# Patient Record
Sex: Female | Born: 1990 | Race: White | Hispanic: No | Marital: Married | Smoking: Never smoker
Health system: Southern US, Community
[De-identification: ages and names within clinical notes are randomized; demographics above are authoritative.]

## PROBLEM LIST (undated history)

## (undated) ENCOUNTER — Inpatient Hospital Stay (HOSPITAL_COMMUNITY): Payer: Self-pay

## (undated) DIAGNOSIS — F53 Postpartum depression: Secondary | ICD-10-CM

## (undated) DIAGNOSIS — Z789 Other specified health status: Secondary | ICD-10-CM

## (undated) DIAGNOSIS — Z87898 Personal history of other specified conditions: Secondary | ICD-10-CM

## (undated) DIAGNOSIS — Z9151 Personal history of suicidal behavior: Secondary | ICD-10-CM

## (undated) HISTORY — PX: NO PAST SURGERIES: SHX2092

## (undated) HISTORY — DX: Other specified health status: Z78.9

---

## 2013-11-03 ENCOUNTER — Encounter (HOSPITAL_COMMUNITY): Payer: Self-pay | Admitting: Emergency Medicine

## 2013-11-03 ENCOUNTER — Emergency Department (INDEPENDENT_AMBULATORY_CARE_PROVIDER_SITE_OTHER)
Admission: EM | Admit: 2013-11-03 | Discharge: 2013-11-03 | Disposition: A | Discharge status: Home / Self Care | Payer: Self-pay | Source: Home / Self Care

## 2013-11-03 ENCOUNTER — Other Ambulatory Visit (HOSPITAL_COMMUNITY)
Admission: RE | Admit: 2013-11-03 | Discharge: 2013-11-03 | Disposition: A | Discharge status: Home / Self Care | Payer: Self-pay | Source: Ambulatory Visit | Attending: Family Medicine | Admitting: Family Medicine

## 2013-11-03 DIAGNOSIS — Z113 Encounter for screening for infections with a predominantly sexual mode of transmission: Secondary | ICD-10-CM | POA: Insufficient documentation

## 2013-11-03 DIAGNOSIS — R300 Dysuria: Secondary | ICD-10-CM

## 2013-11-03 DIAGNOSIS — N72 Inflammatory disease of cervix uteri: Secondary | ICD-10-CM

## 2013-11-03 DIAGNOSIS — N898 Other specified noninflammatory disorders of vagina: Secondary | ICD-10-CM

## 2013-11-03 DIAGNOSIS — N760 Acute vaginitis: Secondary | ICD-10-CM | POA: Insufficient documentation

## 2013-11-03 LAB — POCT URINALYSIS DIP (DEVICE)
BILIRUBIN URINE: NEGATIVE
GLUCOSE, UA: NEGATIVE mg/dL
Ketones, ur: NEGATIVE mg/dL
NITRITE: NEGATIVE
PH: 6 (ref 5.0–8.0)
Protein, ur: 30 mg/dL — AB
Specific Gravity, Urine: 1.025 (ref 1.005–1.030)
Urobilinogen, UA: 0.2 mg/dL (ref 0.0–1.0)

## 2013-11-03 LAB — POCT PREGNANCY, URINE: Preg Test, Ur: NEGATIVE

## 2013-11-03 MED ORDER — METRONIDAZOLE 500 MG PO TABS: 500.0000 mg | ORAL_TABLET | Freq: Two times a day (BID) | ORAL | Status: DC

## 2013-11-03 MED ORDER — FLUCONAZOLE 150 MG PO TABS: ORAL_TABLET | ORAL | Status: DC

## 2013-11-03 NOTE — ED Notes (Signed)
Call back number verified.  

## 2013-11-03 NOTE — ED Provider Notes (Addendum)
CSN: [id number]     Arrival date & time 11/03/13  1028 History   First MD Initiated Contact with Patient 11/03/13 1046     Chief Complaint  Patient presents with  . Vaginal Discharge   (Consider location/radiation/quality/duration/timing/severity/associated sxs/prior Treatment) HPI Comments: 23 year old female complaining of dysuria for 2 weeks. This is associated with a vaginal discharge and odor. Denies frequency or urgency. Denies abdominal or pelvic pain. She is sexually active without using birth control. LMP 10/12/2013   History reviewed. No pertinent past medical history. History reviewed. No pertinent past surgical history. No family history on file. History  Substance Use Topics  . Smoking status: Never Smoker   . Smokeless tobacco: Not on file  . Alcohol Use: No   OB History   Grav Para Term Preterm Abortions TAB SAB Ect Mult Living                 Review of Systems  Constitutional: Negative.   Cardiovascular: Negative.   Gastrointestinal: Negative for nausea and vomiting.  Genitourinary: Positive for dysuria and vaginal discharge. Negative for urgency, frequency, flank pain, vaginal bleeding and pelvic pain.  Musculoskeletal: Negative.     Allergies  Review of patient's allergies indicates no known allergies.  Home Medications   Prior to Admission medications   Not on File   BP 127/77  Pulse 70  Temp(Src) 98 F (36.7 C) (Oral)  Resp 14  SpO2 100%  LMP 10/12/2013 Physical Exam  Nursing note and vitals reviewed. Constitutional: She is oriented to person, place, and time. She appears well-developed and well-nourished. No distress.  Neck: Normal range of motion. Neck supple.  Pulmonary/Chest: Effort normal. No respiratory distress.  Genitourinary:  Normal external female genitalia There is erythema to the mucosa of the labia majora and minora as well as the urethral meatus. Moderate amount of creamy white to green discharge on the vaginal walls and  within the vaginal vault. Cervix is midline, os nulliparous, ectocx with erythema and abrased appearance, friable and mildly tender. No CMT , equivocal bilat adnexal tenderness without palpable mass or ovary.  Musculoskeletal: She exhibits no edema and no tenderness.  Neurological: She is alert and oriented to person, place, and time. She exhibits normal muscle tone.  Skin: Skin is warm and dry.  Psychiatric: She has a normal mood and affect.    ED Course  Procedures (including critical care time) Labs Review Labs Reviewed  POCT URINALYSIS DIP (DEVICE) - Abnormal; Notable for the following:    Hgb urine dipstick TRACE (*)    Protein, ur 30 (*)    Leukocytes, UA SMALL (*)    All other components within normal limits  URINE CULTURE  POCT PREGNANCY, URINE  CERVICOVAGINAL ANCILLARY ONLY    Imaging Review No results found. Results for orders placed during the hospital encounter of 11/03/13  POCT URINALYSIS DIP (DEVICE)      Result Value Ref Range   Glucose, UA NEGATIVE  NEGATIVE mg/dL   Bilirubin Urine NEGATIVE  NEGATIVE   Ketones, ur NEGATIVE  NEGATIVE mg/dL   Specific Gravity, Urine 1.025  1.005 - 1.030   Hgb urine dipstick TRACE (*) NEGATIVE   pH 6.0  5.0 - 8.0   Protein, ur 30 (*) NEGATIVE mg/dL   Urobilinogen, UA 0.2  0.0 - 1.0 mg/dL   Nitrite NEGATIVE  NEGATIVE   Leukocytes, UA SMALL (*) NEGATIVE  POCT PREGNANCY, URINE      Result Value Ref Range   Preg Test, Ur NEGATIVE  NEGATIVE     MDM   1. Vaginal discharge   2. Cervicitis   3. Dysuria     Most likely the dysuria is due to vulvovaginal irritation from candidiasis. Will hold ABX until culture returns Flagyl bid Diflucan 150 mg x 2 . Cytology pending. Will tx accordingly    Hayden Rasmussenavid Skyelyn Scruggs, NP 11/03/13 1123 11/07/13: Chlamydia pos. Tx with azithromycin 1 gm po via Rx. Rosalita ChessmanSuzanne, RN to call pt and Rx in.  Hayden Rasmussenavid Trinka Keshishyan, NP 11/07/13 1555

## 2013-11-03 NOTE — ED Notes (Addendum)
Pt c/o white vag d/c onset 2 weeks; Partner being treated for Chlam Sx also include dysuria and foul odor Denies f/v/n/d, abd pain; LMP = 10/12/13 Alert w/no signs of acute distress.

## 2013-11-03 NOTE — Discharge Instructions (Signed)
Cervicitis Cervicitis is a soreness and swelling (inflammation) of the cervix. Your cervix is located at the bottom of your uterus. It opens up to the vagina. CAUSES   Sexually transmitted infections (STIs).   Allergic reaction.   Medicines or birth control devices that are put in the vagina.   Injury to the cervix.   Bacterial infections.  RISK FACTORS You are at greater risk if you:  Have unprotected sexual intercourse.  Have sexual intercourse with many partners.  Began sexual intercourse at an early age.  Have a history of STIs. SYMPTOMS  There may be no symptoms. If symptoms occur, they may include:   Grey, white, yellow, or bad-smelling vaginal discharge.   Pain or itching of the area outside the vagina.   Painful sexual intercourse.   Lower abdominal or lower back pain, especially during intercourse.   Frequent urination.   Abnormal vaginal bleeding between periods, after sexual intercourse, or after menopause.   Pressure or a heavy feeling in the pelvis.  DIAGNOSIS  Diagnosis is made after a pelvic exam. Other tests may include:   Examination of any discharge under a microscope (wet prep).   A Pap test.  TREATMENT  Treatment will depend on the cause of cervicitis. If it is caused by an STI, both you and your partner will need to be treated. Antibiotic medicines will be given.  HOME CARE INSTRUCTIONS   Do not have sexual intercourse until your health care provider says it is okay.   Do not have sexual intercourse until your partner has been treated, if your cervicitis is caused by an STI.   Take your antibiotics as directed. Finish them even if you start to feel better.  SEEK MEDICAL CARE IF:  Your symptoms come back.   You have a fever.  MAKE SURE YOU:   Understand these instructions.  Will watch your condition.  Will get help right away if you are not doing well or get worse. Document Released: 06/01/2005 Document Revised:  02/01/2013 Document Reviewed: 11/23/2012 [facility] Patient Information 2014 [place].  Dysuria Dysuria is the medical term for pain with urination. There are many causes for dysuria, but urinary tract infection is the most common. If a urinalysis was performed it can show that there is a urinary tract infection. A urine culture confirms that you or your child is sick. You will need to follow up with a healthcare provider because:  If a urine culture was done you will need to know the culture results and treatment recommendations.  If the urine culture was positive, you or your child will need to be put on antibiotics or know if the antibiotics prescribed are the right antibiotics for your urinary tract infection.  If the urine culture is negative (no urinary tract infection), then other causes may need to be explored or antibiotics need to be stopped. Today laboratory work may have been done and there does not seem to be an infection. If cultures were done they will take at least 24 to 48 hours to be completed. Today x-rays may have been taken and they read as normal. No cause can be found for the problems. The x-rays may be re-read by a radiologist and you will be contacted if additional findings are made. You or your child may have been put on medications to help with this problem until you can see your primary caregiver. If the problems get better, see your primary caregiver if the problems return. If you were given  antibiotics (medications which kill germs), take all of the mediations as directed for the full course of treatment.  If laboratory work was done, you need to find the results. Leave a telephone number where you can be reached. If this is not possible, make sure you find out how you are to get test results. HOME CARE INSTRUCTIONS   Drink lots of fluids. For adults, drink eight, 8 ounce glasses of clear juice or water a day. For children, replace fluids as suggested by your  caregiver.  Empty the bladder often. Avoid holding urine for long periods of time.  After a bowel movement, women should cleanse front to back, using each tissue only once.  Empty your bladder before and after sexual intercourse.  Take all the medicine given to you until it is gone. You may feel better in a few days, but TAKE ALL MEDICINE.  Avoid caffeine, tea, alcohol and carbonated beverages, because they tend to irritate the bladder.  In men, alcohol may irritate the prostate.  Only take over-the-counter or prescription medicines for pain, discomfort, or fever as directed by your caregiver.  If your caregiver has given you a follow-up appointment, it is very important to keep that appointment. Not keeping the appointment could result in a chronic or permanent injury, pain, and disability. If there is any problem keeping the appointment, you must call back to this facility for assistance. SEEK IMMEDIATE MEDICAL CARE IF:   Back pain develops.  A fever develops.  There is nausea (feeling sick to your stomach) or vomiting (throwing up).  Problems are no better with medications or are getting worse. MAKE SURE YOU:   Understand these instructions.  Will watch your condition.  Will get help right away if you are not doing well or get worse. Document Released: 02/28/2004 Document Revised: 08/24/2011 Document Reviewed: 01/05/2008 [facility] Patient Information 2014 [place], [place].  Sexually Transmitted Disease A sexually transmitted disease (STD) is a disease or infection. It may be passed from person to person. It usually is passed during sex. STDs can be spread by different types of germs. These germs are bacteria, viruses, and parasites. An STD can be passed through:  Spit (saliva).  Semen.  Blood.  Mucus from the vagina.  Pee (urine). HOW CAN I LESSEN MY CHANCES OF GETTING AN STD?  Only use condoms labeled "latex," dental dams, and lubricants that wash away with water  (water soluble). Do not use petroleum jelly or oils.  Get shots (vaccines) for HPV and hepatitis.  Avoid risky sex behavior that can break the skin. WHAT SHOULD I DO IF I THINK I HAVE AN STD?  See your doctor.  Tell your sex partner(s) that you have an STD. They should be tested and treated.  Do not have sex until your doctor says it is OK. WHEN SHOULD I GET HELP? Get help if:  You have bad belly (abdominal) pain.  You are a man and have puffiness (swelling) or pain in your testicles.  You are a woman and have puffiness in your vagina. MAKE SURE YOU:  Understand these instructions. Document Released: 07/09/2004 Document Revised: 03/22/2013 Document Reviewed: 11/25/2012 [facility] Patient Information 2014 [place], [place].

## 2013-11-04 NOTE — ED Provider Notes (Signed)
Medical screening examination/treatment/procedure(s) were performed by resident physician or non-physician practitioner and as supervising physician I was immediately available for consultation/collaboration.   Averianna Brugger DOUGLAS MD.   Nellene Courtois D Olden Klauer, MD 11/04/13 1128 

## 2013-11-07 ENCOUNTER — Telehealth (HOSPITAL_COMMUNITY): Payer: Self-pay | Admitting: *Deleted

## 2013-11-07 MED ORDER — AZITHROMYCIN 250 MG PO TABS: 250.0000 mg | ORAL_TABLET | Freq: Every day | ORAL | Status: DC

## 2013-11-07 NOTE — ED Notes (Addendum)
GC neg., Chlamydia pos., Affirm: Candida and Gardnerella neg., Trich pos.  Pt. adequately treated with Flagyl.  Hayden Rasmussen NP did order for Zithromax- no pharmacy listed. I called pt. and left a message to call. Call 1. Sheri Hill 11/07/2013 Pt. called back.  Pt. verified x 2 and given results.  Pt. told she was adequately treated for Trich with Flagyl.  You need to notify your partner to be treated with Flagyl, no sex until you have finished your medication and your partner has been treated and to practice safe sex. You can get HIV testing at the [facility] STD clinic.  Pt. told she needs Zithromax for Chlamydia. Pt. wants Rx. called to [facility] at [address]  Rx. called to pharmacist @ [phone]. DHHS form completed and faxed to the [facility] Department. Sheri Hill 11/07/2013

## 2013-11-07 NOTE — ED Provider Notes (Signed)
Medical screening examination/treatment/procedure(s) were performed by resident physician or non-physician practitioner and as supervising physician I was immediately available for consultation/collaboration.   Serena Petterson DOUGLAS MD.   Atticus Wedin D Afshin Chrystal, MD 11/07/13 1644 

## 2014-03-24 ENCOUNTER — Emergency Department (HOSPITAL_COMMUNITY)
Admission: EM | Admit: 2014-03-24 | Discharge: 2014-03-24 | Disposition: A | Discharge status: Home / Self Care | Payer: Self-pay | Source: Home / Self Care

## 2014-03-24 ENCOUNTER — Encounter (HOSPITAL_COMMUNITY): Payer: Self-pay | Admitting: Emergency Medicine

## 2014-03-24 DIAGNOSIS — N944 Primary dysmenorrhea: Secondary | ICD-10-CM

## 2014-03-24 LAB — POCT URINALYSIS DIP (DEVICE)
BILIRUBIN URINE: NEGATIVE
GLUCOSE, UA: NEGATIVE mg/dL
Hgb urine dipstick: NEGATIVE
Ketones, ur: NEGATIVE mg/dL
LEUKOCYTES UA: NEGATIVE
Nitrite: NEGATIVE
PH: 6.5 (ref 5.0–8.0)
Protein, ur: NEGATIVE mg/dL
Specific Gravity, Urine: <=1.005 (ref 1.005–1.030)
Urobilinogen, UA: 0.2 mg/dL (ref 0.0–1.0)

## 2014-03-24 LAB — POCT PREGNANCY, URINE: Preg Test, Ur: NEGATIVE

## 2014-03-24 MED ORDER — TRAMADOL HCL 50 MG PO TABS
50.0000 mg | ORAL_TABLET | Freq: Four times a day (QID) | ORAL | Status: DC | PRN
Start: 2014-03-24 — End: 2015-02-22

## 2014-03-24 NOTE — ED Provider Notes (Signed)
CSN: [id number]     Arrival date & time 03/24/14  1755 History   First MD Initiated Contact with Patient 03/24/14 1843     Chief Complaint  Patient presents with  . Dysmenorrhea   (Consider location/radiation/quality/duration/timing/severity/associated sxs/prior Treatment) HPI Comments: Dysmenorrhea for approx 10 yrs, since onset menarch. Mid pelvic cramping starts about 6 days prior to flow. It then is worse, more painful for another week. Flow is unchanged. Menses regular with a 28 d cycle. None missed.  No new sx's today or recently. Never had this worked up. St she is debilitated at least 3 days a month. Minor pain currently. Has taken several NSAIDS at high doses without relief.    History reviewed. No pertinent past medical history. History reviewed. No pertinent past surgical history. History reviewed. No pertinent family history. History  Substance Use Topics  . Smoking status: Never Smoker   . Smokeless tobacco: Not on file  . Alcohol Use: No   OB History   Grav Para Term Preterm Abortions TAB SAB Ect Mult Living                 Review of Systems  Constitutional: Positive for activity change. Negative for fever and fatigue.  Respiratory: Negative.   Cardiovascular: Negative.   Gastrointestinal: Negative.   Genitourinary: Positive for menstrual problem and pelvic pain. Negative for dysuria, frequency, flank pain, vaginal bleeding, vaginal discharge and difficulty urinating.       No current flow.  Musculoskeletal: Negative.   Skin: Negative.   Neurological: Negative.     Allergies  Review of patient's allergies indicates no known allergies.  Home Medications   Prior to Admission medications   Medication Sig Start Date End Date Taking? Authorizing Provider  traMADol (ULTRAM) 50 MG tablet Take 1 tablet (50 mg total) by mouth every 6 (six) hours as needed. 03/24/14   Hayden Rasmussenavid Tennyson Kallen, NP   BP 129/72  Pulse 71  Temp(Src) 98.3 F (36.8 C) (Oral)  Resp 16  SpO2  99% Physical Exam  Nursing note and vitals reviewed. Constitutional: She is oriented to person, place, and time. She appears well-developed and well-nourished. She appears distressed.  Eyes: Conjunctivae and EOM are normal.  Neck: Normal range of motion. Neck supple.  Cardiovascular: Normal rate, regular rhythm, normal heart sounds and intact distal pulses.   Pulmonary/Chest: Effort normal and breath sounds normal. No respiratory distress.  Abdominal: Soft. She exhibits no distension. There is no tenderness. There is no rebound.  Musculoskeletal: She exhibits no edema and no tenderness.  Lymphadenopathy:    She has no cervical adenopathy.  Neurological: She is alert and oriented to person, place, and time. No cranial nerve deficit. She exhibits normal muscle tone.  Skin: Skin is warm and dry.  Psychiatric: She has a normal mood and affect.    ED Course  Procedures (including critical care time) Labs Review Labs Reviewed  POCT URINALYSIS DIP (DEVICE)  POCT PREGNANCY, URINE    Imaging Review No results found.   MDM   1. Primary dysmenorrhea    Continue naprosyn and start Tramadol prn Call Dr. Duanne Guessewey Monday for appointment    Hayden Rasmussenavid Brittinie Wherley, NP 03/24/14 1902

## 2014-03-24 NOTE — Discharge Instructions (Signed)

## 2014-03-24 NOTE — ED Notes (Signed)
C/o bad menstrual cramps. History of chlamydia, not sexually active at present

## 2014-03-24 NOTE — ED Provider Notes (Signed)
Medical screening examination/treatment/procedure(s) were performed by non-physician practitioner and as supervising physician I was immediately available for consultation/collaboration.  Leslee Homeavid Mariaisabel Bodiford, M.D.  Reuben Likesavid C Suzetta Timko, MD 03/24/14 Ernestina [id number]

## 2015-01-13 ENCOUNTER — Emergency Department (INDEPENDENT_AMBULATORY_CARE_PROVIDER_SITE_OTHER): Payer: Self-pay

## 2015-01-13 ENCOUNTER — Emergency Department (INDEPENDENT_AMBULATORY_CARE_PROVIDER_SITE_OTHER)
Admission: EM | Admit: 2015-01-13 | Discharge: 2015-01-13 | Disposition: A | Discharge status: Nursing Home (Includes ICF) | Payer: Self-pay | Source: Home / Self Care

## 2015-01-13 ENCOUNTER — Encounter (HOSPITAL_COMMUNITY): Payer: Self-pay

## 2015-01-13 ENCOUNTER — Emergency Department (HOSPITAL_COMMUNITY): Payer: Self-pay

## 2015-01-13 ENCOUNTER — Emergency Department (HOSPITAL_COMMUNITY)
Admission: EM | Admit: 2015-01-13 | Discharge: 2015-01-14 | Disposition: A | Discharge status: Home / Self Care | Payer: Self-pay | Attending: Emergency Medicine | Admitting: Emergency Medicine

## 2015-01-13 ENCOUNTER — Encounter (HOSPITAL_COMMUNITY): Payer: Self-pay | Admitting: Emergency Medicine

## 2015-01-13 DIAGNOSIS — S161XXA Strain of muscle, fascia and tendon at neck level, initial encounter: Secondary | ICD-10-CM | POA: Insufficient documentation

## 2015-01-13 DIAGNOSIS — R51 Headache: Secondary | ICD-10-CM

## 2015-01-13 DIAGNOSIS — M542 Cervicalgia: Secondary | ICD-10-CM

## 2015-01-13 DIAGNOSIS — Y998 Other external cause status: Secondary | ICD-10-CM | POA: Insufficient documentation

## 2015-01-13 DIAGNOSIS — S0990XA Unspecified injury of head, initial encounter: Secondary | ICD-10-CM | POA: Insufficient documentation

## 2015-01-13 DIAGNOSIS — Y9389 Activity, other specified: Secondary | ICD-10-CM | POA: Insufficient documentation

## 2015-01-13 DIAGNOSIS — Z3202 Encounter for pregnancy test, result negative: Secondary | ICD-10-CM | POA: Insufficient documentation

## 2015-01-13 DIAGNOSIS — R519 Headache, unspecified: Secondary | ICD-10-CM

## 2015-01-13 DIAGNOSIS — Y92410 Unspecified street and highway as the place of occurrence of the external cause: Secondary | ICD-10-CM | POA: Insufficient documentation
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
## 2015-01-13 NOTE — ED Notes (Signed)
Pt arrives in soft c-collar from [facility].

## 2015-01-14 LAB — PREGNANCY, URINE: PREG TEST UR: NEGATIVE

## 2015-01-14 NOTE — ED Notes (Signed)
Soft collar removed by EDP

## 2015-01-14 NOTE — Discharge Instructions (Signed)
Cervical Sprain A cervical sprain is when the tissues (ligaments) that hold the neck bones in place stretch or tear. HOME CARE   Put ice on the injured area.  Put ice in a plastic bag.  Place a towel between your skin and the bag.  Leave the ice on for 15-20 minutes, 3-4 times a day.  You may have been given a collar to wear. This collar keeps your neck from moving while you heal.  Do not take the collar off unless told by your doctor.  If you have long hair, keep it outside of the collar.  Ask your doctor before changing the position of your collar. You may need to change its position over time to make it more comfortable.  If you are allowed to take off the collar for cleaning or bathing, follow your doctor's instructions on how to do it safely.  Keep your collar clean by wiping it with mild soap and water. Dry it completely. If the collar has removable pads, remove them every 1-2 days to hand wash them with soap and water. Allow them to air dry. They should be dry before you wear them in the collar.  Do not drive while wearing the collar.  Only take medicine as told by your doctor.  Keep all doctor visits as told.  Keep all physical therapy visits as told.  Adjust your work station so that you have good posture while you work.  Avoid positions and activities that make your problems worse.  Warm up and stretch before being active. GET HELP IF:  Your pain is not controlled with medicine.  You cannot take less pain medicine over time as planned.  Your activity level does not improve as expected. GET HELP RIGHT AWAY IF:   You are bleeding.  Your stomach is upset.  You have an allergic reaction to your medicine.  You develop new problems that you cannot explain.  You lose feeling (become numb) or you cannot move any part of your body (paralysis).  You have tingling or weakness in any part of your body.  Your symptoms get worse. Symptoms include:  Pain,  soreness, stiffness, puffiness (swelling), or a burning feeling in your neck.  Pain when your neck is touched.  Shoulder or upper back pain.  Limited ability to move your neck.  Headache.  Dizziness.  Your hands or arms feel week, lose feeling, or tingle.  Muscle spasms.  Difficulty swallowing or chewing. MAKE SURE YOU:   Understand these instructions.  Will watch your condition.  Will get help right away if you are not doing well or get worse. Document Released: 11/18/2007 Document Revised: 02/01/2013 Document Reviewed: 12/07/2012 ExitCare Patient Information 2015 [employer]. This information is not intended to replace advice given to you by your health care provider. Make sure you discuss any questions you have with your health care provider.  

## 2015-02-22 ENCOUNTER — Encounter (HOSPITAL_COMMUNITY): Payer: Self-pay | Admitting: Emergency Medicine

## 2015-02-22 ENCOUNTER — Emergency Department (INDEPENDENT_AMBULATORY_CARE_PROVIDER_SITE_OTHER)
Admission: EM | Admit: 2015-02-22 | Discharge: 2015-02-22 | Disposition: A | Discharge status: Home / Self Care | Payer: Self-pay | Source: Home / Self Care | Attending: Family Medicine | Admitting: Family Medicine

## 2015-02-22 ENCOUNTER — Other Ambulatory Visit (HOSPITAL_COMMUNITY)
Admission: RE | Admit: 2015-02-22 | Discharge: 2015-02-22 | Disposition: A | Discharge status: Home / Self Care | Payer: Self-pay | Source: Ambulatory Visit | Attending: Family Medicine | Admitting: Family Medicine

## 2015-02-22 DIAGNOSIS — N760 Acute vaginitis: Secondary | ICD-10-CM | POA: Insufficient documentation

## 2015-02-22 DIAGNOSIS — Z113 Encounter for screening for infections with a predominantly sexual mode of transmission: Secondary | ICD-10-CM | POA: Insufficient documentation

## 2015-02-22 LAB — POCT URINALYSIS DIP (DEVICE)
Bilirubin Urine: NEGATIVE
Glucose, UA: NEGATIVE mg/dL
Hgb urine dipstick: NEGATIVE
Ketones, ur: NEGATIVE mg/dL
Leukocytes, UA: NEGATIVE
Nitrite: NEGATIVE
Protein, ur: NEGATIVE mg/dL
Specific Gravity, Urine: 1.015 (ref 1.005–1.030)
Urobilinogen, UA: 0.2 mg/dL (ref 0.0–1.0)
pH: 6 (ref 5.0–8.0)

## 2015-02-22 LAB — POCT PREGNANCY, URINE: Preg Test, Ur: NEGATIVE

## 2015-02-22 MED ORDER — METRONIDAZOLE 500 MG PO TABS: 500.0000 mg | ORAL_TABLET | Freq: Two times a day (BID) | ORAL | Status: DC

## 2015-02-22 NOTE — ED Notes (Signed)
Pt c/o vag d/c onset 1 week associated w/foul odor and vaginal pain Has tried OTC monistat and believes she may have had a reaction to it Denies abd pain, fevers, chills, urinary sx Alert and oriented x4... No acute distress.

## 2015-02-22 NOTE — Discharge Instructions (Signed)
You likely have bacterial vaginosis. Please start the metronidazole. We will call you if any other lab results come back positive.

## 2015-02-22 NOTE — ED Provider Notes (Signed)
CSN: [id number]     Arrival date & time 02/22/15  1711 History   First MD Initiated Contact with Patient 02/22/15 1740     Chief Complaint  Patient presents with  . Vaginal Discharge   (Consider location/radiation/quality/duration/timing/severity/associated sxs/prior Treatment) HPI   Vaginal itching and burning. Started 1 week ago. Patient use Monistat couple days after onset of symptoms. This did not improve her symptoms in fact she feels like this made it worse. She subsequently developed vaginal discharge. This is foul-smelling and discolored. Patient essentially active with her husband. No condoms are used. Denies any lower abdominal pain, fevers, dysuria, frequency, back pain, rash, adenopathy. States that she has had a history of chlamydia infection before. Last metastases appeared was on a 02/05/2015.   History reviewed. No pertinent past medical history. History reviewed. No pertinent past surgical history. Family History  Problem Relation Age of Onset  . Cancer Neg Hx   . Diabetes Neg Hx   . Heart failure Neg Hx    Social History  Substance Use Topics  . Smoking status: Never Smoker   . Smokeless tobacco: None  . Alcohol Use: Yes   OB History    No data available     Review of Systems Per HPI with all other pertinent systems negative.   Allergies  Review of patient's allergies indicates no known allergies.  Home Medications   Prior to Admission medications   Medication Sig Start Date End Date Taking? Authorizing Provider  metroNIDAZOLE (FLAGYL) 500 MG tablet Take 1 tablet (500 mg total) by mouth 2 (two) times daily. 02/22/15   Ozella Rocks, MD   Meds Ordered and Administered this Visit  Medications - No data to display  BP 129/73 mmHg  Pulse 76  Temp(Src) 98.3 F (36.8 C) (Oral)  Resp 12  SpO2 100%  LMP 02/05/2015 No data found.   Physical Exam Physical Exam  Constitutional: oriented to person, place, and time. appears well-developed and  well-nourished. No distress.  HENT:  Head: Normocephalic and atraumatic.  Eyes: EOMI. PERRL.  Neck: Normal range of motion.  Cardiovascular: RRR, no m/r/g, 2+ distal pulses,  Pulmonary/Chest: Effort normal and breath sounds normal. No respiratory distress.  Abdominal: Soft. Bowel sounds are normal. NonTTP, no distension.  Musculoskeletal: Normal range of motion. Non ttp, no effusion.  Neurological: alert and oriented to person, place, and time.  Skin: Skin is warm. No rash noted. non diaphoretic.  Psychiatric: normal mood and affect. behavior is normal. Judgment and thought content normal.  GU: Vaginal walls well rugated, no lesions appreciated, no cervical motion tenderness, copious thick white discharge. ED Course  Procedures (including critical care time)  Labs Review Labs Reviewed  POCT URINALYSIS DIP (DEVICE)  POCT PREGNANCY, URINE  CYTOLOGY, (ORAL, ANAL, URETHRAL) ANCILLARY ONLY    Imaging Review No results found.   Visual Acuity Review  Right Eye Distance:   Left Eye Distance:   Bilateral Distance:    Right Eye Near:   Left Eye Near:    Bilateral Near:         MDM   1. Vaginitis    Suspect BV. Start Metro. Wet prep and STD panel sent.    Ozella Rocks, MD 02/22/15 [phone]

## 2015-02-22 NOTE — ED Notes (Signed)
Call back number verified.  

## 2015-02-25 LAB — CYTOLOGY, (ORAL, ANAL, URETHRAL) ANCILLARY ONLY
Chlamydia: NEGATIVE
NEISSERIA GONORRHEA: NEGATIVE

## 2015-02-26 LAB — CYTOLOGY, (ORAL, ANAL, URETHRAL) ANCILLARY ONLY: WET PREP (BD AFFIRM): POSITIVE — AB

## 2015-02-26 NOTE — ED Notes (Signed)
Final report of vaginal swabs positive for gardnerella. Called number listed, but husband has phone w him at work. Will attempt to call patient later when I can speak w her directly

## 2015-08-21 ENCOUNTER — Encounter (HOSPITAL_COMMUNITY): Payer: Self-pay | Admitting: Emergency Medicine

## 2015-08-21 ENCOUNTER — Emergency Department (INDEPENDENT_AMBULATORY_CARE_PROVIDER_SITE_OTHER)
Admission: EM | Admit: 2015-08-21 | Discharge: 2015-08-21 | Disposition: A | Discharge status: Home / Self Care | Payer: Medicaid Other | Source: Home / Self Care | Attending: Family Medicine | Admitting: Family Medicine

## 2015-08-21 DIAGNOSIS — R69 Illness, unspecified: Principal | ICD-10-CM

## 2015-08-21 DIAGNOSIS — J111 Influenza due to unidentified influenza virus with other respiratory manifestations: Secondary | ICD-10-CM | POA: Diagnosis not present

## 2015-08-21 LAB — POCT URINALYSIS DIP (DEVICE)
BILIRUBIN URINE: NEGATIVE
GLUCOSE, UA: NEGATIVE mg/dL
HGB URINE DIPSTICK: NEGATIVE
Ketones, ur: NEGATIVE mg/dL
LEUKOCYTES UA: NEGATIVE
NITRITE: NEGATIVE
Protein, ur: NEGATIVE mg/dL
Specific Gravity, Urine: 1.025 (ref 1.005–1.030)
Urobilinogen, UA: 0.2 mg/dL (ref 0.0–1.0)
pH: 6 (ref 5.0–8.0)

## 2015-08-21 MED ORDER — IPRATROPIUM BROMIDE 0.06 % NA SOLN: 2.0000 | Freq: Four times a day (QID) | NASAL | Status: DC

## 2015-08-21 NOTE — ED Provider Notes (Signed)
CSN: [id number]     Arrival date & time 08/21/15  1545 History   First MD Initiated Contact with Patient 08/21/15 1800     Chief Complaint  Patient presents with  . Influenza    fever, runny nose, body aches, fatigue   (Consider location/radiation/quality/duration/timing/severity/associated sxs/prior Treatment) Patient is a 25 y.o. female presenting with flu symptoms. The history is provided by the patient.  Influenza Presenting symptoms: cough, fever, myalgias, rhinorrhea and sore throat   Presenting symptoms: no shortness of breath and no vomiting   Severity:  Mild Onset quality:  Gradual Duration:  3 days Progression:  Unchanged Chronicity:  New Relieved by:  None tried Worsened by:  Nothing tried Associated symptoms: nasal congestion   Associated symptoms: no chills   Risk factors: pregnancy and sick contacts   Risk factors comment:  Husband sick   History reviewed. No pertinent past medical history. History reviewed. No pertinent past surgical history. Family History  Problem Relation Age of Onset  . Cancer Neg Hx   . Diabetes Neg Hx   . Heart failure Neg Hx    Social History  Substance Use Topics  . Smoking status: Never Smoker   . Smokeless tobacco: None  . Alcohol Use: No   OB History    Gravida Para Term Preterm AB TAB SAB Ectopic Multiple Living   1              Review of Systems  Constitutional: Positive for fever. Negative for chills.  HENT: Positive for congestion, postnasal drip, rhinorrhea and sore throat.   Respiratory: Positive for cough. Negative for shortness of breath.   Cardiovascular: Negative.   Gastrointestinal: Negative.  Negative for vomiting.  Genitourinary: Negative.   Musculoskeletal: Positive for myalgias.  All other systems reviewed and are negative.   Allergies  Review of patient's allergies indicates no known allergies.  Home Medications   Prior to Admission medications   Medication Sig Start Date End Date Taking?  Authorizing Provider  Prenatal Vit-Fe Fumarate-FA (PRENATAL MULTIVITAMIN) TABS tablet Take 1 tablet by mouth daily at 12 noon.   Yes Historical Provider, MD  metroNIDAZOLE (FLAGYL) 500 MG tablet Take 1 tablet (500 mg total) by mouth 2 (two) times daily. 02/22/15   Ozella Rocksavid J Merrell, MD   Meds Ordered and Administered this Visit  Medications - No data to display  BP 124/81 mmHg  Pulse 112  Temp(Src) 99.2 F (37.3 C) (Oral)  Resp 18  Ht 5\' 4"  (1.626 m)  Wt 118 lb (53.524 kg)  BMI 20.24 kg/m2  SpO2 98%  LMP 02/05/2015 No data found.   Physical Exam  Constitutional: She is oriented to person, place, and time. She appears well-developed and well-nourished. No distress.  HENT:  Right Ear: External ear normal.  Left Ear: External ear normal.  Mouth/Throat: Oropharynx is clear and moist.  Neck: Normal range of motion. Neck supple.  Pulmonary/Chest: Effort normal and breath sounds normal.  Abdominal: Soft. Bowel sounds are normal. She exhibits mass.  24wk gravid uterus.  Lymphadenopathy:    She has no cervical adenopathy.  Neurological: She is alert and oriented to person, place, and time.  Skin: Skin is warm and dry.  Nursing note and vitals reviewed.   ED Course  Procedures (including critical care time)  Labs Review Labs Reviewed  POCT URINALYSIS DIP (DEVICE)    Imaging Review No results found.   Visual Acuity Review  Right Eye Distance:   Left Eye Distance:   Bilateral Distance:  Right Eye Near:   Left Eye Near:    Bilateral Near:         MDM  No diagnosis found. Meds ordered this encounter  Medications  . Prenatal Vit-Fe Fumarate-FA (PRENATAL MULTIVITAMIN) TABS tablet    Sig: Take 1 tablet by mouth daily at 12 noon.  Marland Kitchen ipratropium (ATROVENT) 0.06 % nasal spray    Sig: Place 2 sprays into both nostrils 4 (four) times daily.    Dispense:  15 mL    Refill:  1      Linna Hoff, MD 08/21/15 [phone]

## 2015-08-21 NOTE — Discharge Instructions (Signed)
Drink plenty of fluids as discussed, use medicine as prescribed, and mucinex or delsym for cough. Return or see your doctor if further problems °

## 2015-08-21 NOTE — ED Notes (Addendum)
PT reports fever, body aches, runny nose, and fatigue that started yesterday. Husband has similar symptoms. PT is [redacted] weeks pregnant. PT denies abdominal pain, back pain, vaginal bleeding, or discharge.

## 2015-08-28 ENCOUNTER — Other Ambulatory Visit (HOSPITAL_COMMUNITY): Payer: Self-pay | Admitting: Obstetrics and Gynecology

## 2015-08-28 DIAGNOSIS — O283 Abnormal ultrasonic finding on antenatal screening of mother: Secondary | ICD-10-CM

## 2015-08-30 ENCOUNTER — Encounter (HOSPITAL_COMMUNITY): Payer: Self-pay

## 2015-08-30 ENCOUNTER — Other Ambulatory Visit (HOSPITAL_COMMUNITY): Payer: Self-pay | Admitting: Obstetrics and Gynecology

## 2015-08-30 ENCOUNTER — Ambulatory Visit (HOSPITAL_COMMUNITY): Admission: RE | Admit: 2015-08-30 | Payer: Medicaid Other | Source: Ambulatory Visit

## 2015-08-30 ENCOUNTER — Ambulatory Visit (HOSPITAL_COMMUNITY)
Admission: RE | Admit: 2015-08-30 | Discharge: 2015-08-30 | Disposition: A | Discharge status: Home / Self Care | Payer: Medicaid Other | Source: Ambulatory Visit | Attending: Obstetrics and Gynecology | Admitting: Obstetrics and Gynecology

## 2015-08-30 DIAGNOSIS — Z36 Encounter for antenatal screening of mother: Secondary | ICD-10-CM | POA: Insufficient documentation

## 2015-08-30 DIAGNOSIS — O283 Abnormal ultrasonic finding on antenatal screening of mother: Secondary | ICD-10-CM

## 2015-08-30 DIAGNOSIS — Z3A25 25 weeks gestation of pregnancy: Secondary | ICD-10-CM | POA: Diagnosis not present

## 2015-08-30 DIAGNOSIS — O358XX0 Maternal care for other (suspected) fetal abnormality and damage, not applicable or unspecified: Secondary | ICD-10-CM | POA: Insufficient documentation

## 2015-08-30 DIAGNOSIS — Z1389 Encounter for screening for other disorder: Secondary | ICD-10-CM

## 2015-09-06 ENCOUNTER — Other Ambulatory Visit (HOSPITAL_COMMUNITY): Payer: Self-pay

## 2016-07-04 ENCOUNTER — Encounter (HOSPITAL_COMMUNITY): Payer: Self-pay

## 2017-06-15 NOTE — L&D Delivery Note (Signed)
Delivery Note At 12:06 PM a viable female was delivered via Vaginal, Spontaneous (Presentation:LOT ; LOA ).  APGAR: 9, 9; weight pending .   Placenta status: intact, send to labor and delivery .  Cord:three vessels  with the following complications:none .  Cord pH: not sent  Anesthesia: local anesthetic for laceration repair Episiotomy: None Lacerations: 1st degree;Periurethral Suture Repair: 4.0 Monocryl Est. Blood Loss (mL): 250  Mom to postpartum.  Baby to Couplet care / Skin to Skin.  Calvert Cantor, CNM 11/09/2017, 12:24 PM

## 2017-07-09 ENCOUNTER — Other Ambulatory Visit: Payer: Self-pay | Admitting: Obstetrics & Gynecology

## 2017-07-09 DIAGNOSIS — Z363 Encounter for antenatal screening for malformations: Secondary | ICD-10-CM

## 2017-07-12 ENCOUNTER — Ambulatory Visit (INDEPENDENT_AMBULATORY_CARE_PROVIDER_SITE_OTHER): Payer: Medicaid Other

## 2017-07-12 DIAGNOSIS — Z3A21 21 weeks gestation of pregnancy: Secondary | ICD-10-CM | POA: Diagnosis not present

## 2017-07-12 DIAGNOSIS — Z363 Encounter for antenatal screening for malformations: Secondary | ICD-10-CM

## 2017-07-12 NOTE — Progress Notes (Signed)
US 21+2 wks,cx 3.9 cm,svp of fluid 4 cm,breech/cephalic,anterior pl gr 0,fhr [id number] bpm,efw 427 g,anatomy complete,no obvious abnormalities

## 2017-07-26 ENCOUNTER — Encounter: Payer: Self-pay | Admitting: Women's Health

## 2017-07-26 ENCOUNTER — Ambulatory Visit (INDEPENDENT_AMBULATORY_CARE_PROVIDER_SITE_OTHER): Payer: Medicaid Other | Admitting: Women's Health

## 2017-07-26 ENCOUNTER — Ambulatory Visit: Payer: Medicaid Other | Admitting: *Deleted

## 2017-07-26 VITALS — BP 120/80 | HR 92 | Wt 111.0 lb

## 2017-07-26 DIAGNOSIS — O0932 Supervision of pregnancy with insufficient antenatal care, second trimester: Secondary | ICD-10-CM | POA: Diagnosis not present

## 2017-07-26 DIAGNOSIS — Z3A23 23 weeks gestation of pregnancy: Secondary | ICD-10-CM

## 2017-07-26 DIAGNOSIS — Z1389 Encounter for screening for other disorder: Secondary | ICD-10-CM

## 2017-07-26 DIAGNOSIS — Z3490 Encounter for supervision of normal pregnancy, unspecified, unspecified trimester: Secondary | ICD-10-CM | POA: Insufficient documentation

## 2017-07-26 DIAGNOSIS — Z331 Pregnant state, incidental: Secondary | ICD-10-CM

## 2017-07-26 DIAGNOSIS — Z3482 Encounter for supervision of other normal pregnancy, second trimester: Secondary | ICD-10-CM

## 2017-07-26 LAB — POCT URINALYSIS DIPSTICK
GLUCOSE UA: NEGATIVE
Ketones, UA: NEGATIVE
LEUKOCYTES UA: NEGATIVE
Nitrite, UA: NEGATIVE
Protein, UA: NEGATIVE
RBC UA: NEGATIVE

## 2017-07-26 NOTE — Progress Notes (Signed)
INITIAL OBSTETRICAL VISIT Patient name: Sheri Hill MRN [id number]  Date of birth: [birth year] Chief Complaint:   Initial Prenatal Visit  History of Present Illness:   Sheri Hill is a 27 y.o. G71P1001 Caucasian female at [redacted]w[redacted]d by LMP c/w 21wk u/s, with an Estimated Date of Delivery: 11/20/17 being seen today for her initial obstetrical visit.   Her obstetrical history is significant for term uncomplicated SVB x 1 in [place].   Today she reports no complaints.  Patient's last menstrual period was 02/13/2017 (exact date). Last pap 2016 w/ [facility]. Results were: normal Review of Systems:   Pertinent items are noted in HPI Denies cramping/contractions, leakage of fluid, vaginal bleeding, abnormal vaginal discharge w/ itching/odor/irritation, headaches, visual changes, shortness of breath, chest pain, abdominal pain, severe nausea/vomiting, or problems with urination or bowel movements unless otherwise stated above.  Pertinent History Reviewed:  Reviewed past medical,surgical, social, obstetrical and family history.  Reviewed problem list, medications and allergies. OB History  Gravida Para Term Preterm AB Living  2 1 1     1   SAB TAB Ectopic Multiple Live Births          1    # Outcome Date GA Lbr Len/2nd Weight Sex Delivery Anes PTL Lv  2 Current           1 Term 12/09/15 [redacted]w[redacted]d  6 lb 8 oz (2.948 kg) F Vag-Spont None N LIV     Physical Assessment:   Vitals:   07/26/17 1344  BP: 120/80  Pulse: 92  Weight: 111 lb (50.3 kg)  Body mass index is 19.05 kg/m.       Physical Examination:  General appearance - well appearing, and in no distress  Mental status - alert, oriented to person, place, and time  Psych:  She has a normal mood and affect  Skin - warm and dry, normal color, no suspicious lesions noted  Chest - effort normal, all lung fields clear to auscultation bilaterally  Heart - normal rate and regular rhythm  Abdomen - soft, nontender  Extremities:  No swelling or  varicosities noted  Pelvic - VULVA: normal appearing vulva with no masses, tenderness or lesions  VAGINA: normal appearing vagina with normal color and discharge, no lesions  CERVIX: normal appearing cervix without discharge or lesions, no CMT  Thin prep pap is not done   Fetal Heart Rate (bpm): 154 via doppler FH: 22cm  Results for orders placed or performed in visit on 07/26/17 (from the past 24 hour(s))  POCT urinalysis dipstick   Collection Time: 07/26/17  2:11 PM  Result Value Ref Range   Color, UA     Clarity, UA     Glucose, UA neg    Bilirubin, UA     Ketones, UA neg    Spec Grav, UA  1.010 - 1.025   Blood, UA neg    pH, UA  5.0 - 8.0   Protein, UA neg    Urobilinogen, UA  0.2 or 1.0 E.U./dL   Nitrite, UA neg    Leukocytes, UA Negative Negative   Appearance g    Odor      Assessment & Plan:  1) Low-Risk Pregnancy G2P1001 at [redacted]w[redacted]d with an Estimated Date of Delivery: 11/20/17   2) Initial OB visit  3) Late prenatal care  Meds: No orders of the defined types were placed in this encounter.   Initial labs obtained, originally didn't want HIV testing, discussed nursery highly advises, does want  to go ahead and get today Continue prenatal vitamins Reviewed n/v relief measures and warning s/s to report Reviewed recommended weight gain based on pre-gravid BMI Encouraged well-balanced diet Genetic Screening discussed Integrated Screen and Quad Screen: too late Cystic fibrosis screening discussed declined Ultrasound discussed; fetal survey: results reviewed CCNC completed> spoke w/ [place]  Follow-up: Return in about 4 weeks (around 08/23/2017) for LROB, PN2.   Orders Placed This Encounter  Procedures  . GC/Chlamydia Probe Amp  . Urine Culture  . Obstetric Panel, Including HIV  . Urinalysis, Routine w reflex microscopic  . Pain Management Screening Profile (10S)  . POCT urinalysis dipstick    Cheral MarkerKimberly R Nikki Glanzer CNM, [facility] HospitalWHNP-BC 07/26/2017 2:37 PM

## 2017-07-26 NOTE — Patient Instructions (Signed)
Diona FantiIda A Balderston, I greatly value your feedback.  If you receive a survey following your visit with us today, we appreciate you taking the time to fill it out.  Thanks, Joellyn HaffKim Solly Derasmo, CNM, WHNP-BC   You will have your sugar test next visit.  Please do not eat or drink anything after midnight the night before you come, not even water.  You will be here for at least two hours.     Call the office [phone]) or go to [facility] if:  You begin to have strong, frequent contractions  Your water breaks.  Sometimes it is a big gush of fluid, sometimes it is just a trickle that keeps getting your panties wet or running down your legs  You have vaginal bleeding.  It is normal to have a small amount of spotting if your cervix was checked.   You don't feel your baby moving like normal.  If you don't, get you something to eat and drink and lay down and focus on feeling your baby move.   If your baby is still not moving like normal, you should call the office or go to [facility].  Second Trimester of Pregnancy The second trimester is from week 13 through week 28, months 4 through 6. The second trimester is often a time when you feel your best. Your body has also adjusted to being pregnant, and you begin to feel better physically. Usually, morning sickness has lessened or quit completely, you may have more energy, and you may have an increase in appetite. The second trimester is also a time when the fetus is growing rapidly. At the end of the sixth month, the fetus is about 9 inches long and weighs about 1 pounds. You will likely begin to feel the baby move (quickening) between 18 and 20 weeks of the pregnancy. BODY CHANGES Your body goes through many changes during pregnancy. The changes vary from woman to woman.   Your weight will continue to increase. You will notice your lower abdomen bulging out.  You may begin to get stretch marks on your hips, abdomen, and breasts.  You may develop headaches  that can be relieved by medicines approved by your health care provider.  You may urinate more often because the fetus is pressing on your bladder.  You may develop or continue to have heartburn as a result of your pregnancy.  You may develop constipation because certain hormones are causing the muscles that push waste through your intestines to slow down.  You may develop hemorrhoids or swollen, bulging veins (varicose veins).  You may have back pain because of the weight gain and pregnancy hormones relaxing your joints between the bones in your pelvis and as a result of a shift in weight and the muscles that support your balance.  Your breasts will continue to grow and be tender.  Your gums may bleed and may be sensitive to brushing and flossing.  Dark spots or blotches (chloasma, mask of pregnancy) may develop on your face. This will likely fade after the baby is born.  A dark line from your belly button to the pubic area (linea nigra) may appear. This will likely fade after the baby is born.  You may have changes in your hair. These can include thickening of your hair, rapid growth, and changes in texture. Some women also have hair loss during or after pregnancy, or hair that feels dry or thin. Your hair will most likely return to normal after your  baby is born. WHAT TO EXPECT AT YOUR PRENATAL VISITS During a routine prenatal visit:  You will be weighed to make sure you and the fetus are growing normally.  Your blood pressure will be taken.  Your abdomen will be measured to track your baby's growth.  The fetal heartbeat will be listened to.  Any test results from the previous visit will be discussed. Your health care provider may ask you:  How you are feeling.  If you are feeling the baby move.  If you have had any abnormal symptoms, such as leaking fluid, bleeding, severe headaches, or abdominal cramping.  If you have any questions. Other tests that may be performed  during your second trimester include:  Blood tests that check for:  Low iron levels (anemia).  Gestational diabetes (between 24 and 28 weeks).  Rh antibodies.  Urine tests to check for infections, diabetes, or protein in the urine.  An ultrasound to confirm the proper growth and development of the baby.  An amniocentesis to check for possible genetic problems.  Fetal screens for spina bifida and Down syndrome. HOME CARE INSTRUCTIONS   Avoid all smoking, herbs, alcohol, and unprescribed drugs. These chemicals affect the formation and growth of the baby.  Follow your health care provider's instructions regarding medicine use. There are medicines that are either safe or unsafe to take during pregnancy.  Exercise only as directed by your health care provider. Experiencing uterine cramps is a good sign to stop exercising.  Continue to eat regular, healthy meals.  Wear a good support bra for breast tenderness.  Do not use hot tubs, steam rooms, or saunas.  Wear your seat belt at all times when driving.  Avoid raw meat, uncooked cheese, cat litter boxes, and soil used by cats. These carry germs that can cause birth defects in the baby.  Take your prenatal vitamins.  Try taking a stool softener (if your health care provider approves) if you develop constipation. Eat more high-fiber foods, such as fresh vegetables or fruit and whole grains. Drink plenty of fluids to keep your urine clear or pale yellow.  Take warm sitz baths to soothe any pain or discomfort caused by hemorrhoids. Use hemorrhoid cream if your health care provider approves.  If you develop varicose veins, wear support hose. Elevate your feet for 15 minutes, 3-4 times a day. Limit salt in your diet.  Avoid heavy lifting, wear low heel shoes, and practice good posture.  Rest with your legs elevated if you have leg cramps or low back pain.  Visit your dentist if you have not gone yet during your pregnancy. Use a soft  toothbrush to brush your teeth and be gentle when you floss.  A sexual relationship may be continued unless your health care provider directs you otherwise.  Continue to go to all your prenatal visits as directed by your health care provider. SEEK MEDICAL CARE IF:   You have dizziness.  You have mild pelvic cramps, pelvic pressure, or nagging pain in the abdominal area.  You have persistent nausea, vomiting, or diarrhea.  You have a bad smelling vaginal discharge.  You have pain with urination. SEEK IMMEDIATE MEDICAL CARE IF:   You have a fever.  You are leaking fluid from your vagina.  You have spotting or bleeding from your vagina.  You have severe abdominal cramping or pain.  You have rapid weight gain or loss.  You have shortness of breath with chest pain.  You notice sudden or extreme  swelling of your face, hands, ankles, feet, or legs.  You have not felt your baby move in over an hour.  You have severe headaches that do not go away with medicine.  You have vision changes. Document Released: 05/26/2001 Document Revised: 06/06/2013 Document Reviewed: 08/02/2012 [facility] Patient Information 2015 [place], [place]. This information is not intended to replace advice given to you by your health care provider. Make sure you discuss any questions you have with your health care provider.

## 2017-07-27 LAB — PMP SCREEN PROFILE (10S), URINE
Amphetamine Scrn, Ur: NEGATIVE ng/mL
BARBITURATE SCREEN URINE: NEGATIVE ng/mL
BENZODIAZEPINE SCREEN, URINE: NEGATIVE ng/mL
CANNABINOIDS UR QL SCN: NEGATIVE ng/mL
CREATININE(CRT), U: 68.4 mg/dL (ref 20.0–300.0)
Cocaine (Metab) Scrn, Ur: NEGATIVE ng/mL
Methadone Screen, Urine: NEGATIVE ng/mL
OXYCODONE+OXYMORPHONE UR QL SCN: NEGATIVE ng/mL
Opiate Scrn, Ur: NEGATIVE ng/mL
PH UR, DRUG SCRN: 6.8 (ref 4.5–8.9)
PHENCYCLIDINE QUANTITATIVE URINE: NEGATIVE ng/mL
Propoxyphene Scrn, Ur: NEGATIVE ng/mL

## 2017-07-27 LAB — URINALYSIS, ROUTINE W REFLEX MICROSCOPIC
BILIRUBIN UA: NEGATIVE
Glucose, UA: NEGATIVE
Ketones, UA: NEGATIVE
Nitrite, UA: NEGATIVE
PH UA: 7 (ref 5.0–7.5)
Protein, UA: NEGATIVE
RBC UA: NEGATIVE
Specific Gravity, UA: 1.021 (ref 1.005–1.030)
Urobilinogen, Ur: 0.2 mg/dL (ref 0.2–1.0)

## 2017-07-27 LAB — OBSTETRIC PANEL, INCLUDING HIV
ANTIBODY SCREEN: NEGATIVE
BASOS ABS: 0 10*3/uL (ref 0.0–0.2)
BASOS: 0 %
EOS (ABSOLUTE): 0.3 10*3/uL (ref 0.0–0.4)
EOS: 3 %
HEMATOCRIT: 36.4 % (ref 34.0–46.6)
HEP B S AG: NEGATIVE
HIV SCREEN 4TH GENERATION: NONREACTIVE
Hemoglobin: 11.8 g/dL (ref 11.1–15.9)
Immature Grans (Abs): 0 10*3/uL (ref 0.0–0.1)
Immature Granulocytes: 0 %
LYMPHS ABS: 1.4 10*3/uL (ref 0.7–3.1)
Lymphs: 13 %
MCH: 30 pg (ref 26.6–33.0)
MCHC: 32.4 g/dL (ref 31.5–35.7)
MCV: 93 fL (ref 79–97)
MONOCYTES: 6 %
Monocytes Absolute: 0.6 10*3/uL (ref 0.1–0.9)
NEUTROS ABS: 8.4 10*3/uL — AB (ref 1.4–7.0)
Neutrophils: 78 %
Platelets: 260 10*3/uL (ref 150–379)
RBC: 3.93 x10E6/uL (ref 3.77–5.28)
RDW: 14 % (ref 12.3–15.4)
RH TYPE: POSITIVE
RPR: NONREACTIVE
RUBELLA: 1.58 {index} (ref >0.99)
WBC: 10.7 10*3/uL (ref 3.4–10.8)

## 2017-07-27 LAB — MICROSCOPIC EXAMINATION: Casts: NONE SEEN /lpf

## 2017-07-27 LAB — MED LIST OPTION NOT SELECTED

## 2017-07-28 LAB — URINE CULTURE

## 2017-07-28 LAB — GC/CHLAMYDIA PROBE AMP
CHLAMYDIA, DNA PROBE: NEGATIVE
Neisseria gonorrhoeae by PCR: NEGATIVE

## 2017-08-23 ENCOUNTER — Ambulatory Visit (INDEPENDENT_AMBULATORY_CARE_PROVIDER_SITE_OTHER): Payer: Medicaid Other | Admitting: Women's Health

## 2017-08-23 ENCOUNTER — Other Ambulatory Visit: Payer: Medicaid Other

## 2017-08-23 ENCOUNTER — Encounter: Payer: Self-pay | Admitting: Women's Health

## 2017-08-23 VITALS — BP 108/60 | HR 79 | Wt 113.5 lb

## 2017-08-23 DIAGNOSIS — Z331 Pregnant state, incidental: Secondary | ICD-10-CM

## 2017-08-23 DIAGNOSIS — Z1389 Encounter for screening for other disorder: Secondary | ICD-10-CM

## 2017-08-23 DIAGNOSIS — Z3A27 27 weeks gestation of pregnancy: Secondary | ICD-10-CM

## 2017-08-23 DIAGNOSIS — O26842 Uterine size-date discrepancy, second trimester: Secondary | ICD-10-CM

## 2017-08-23 DIAGNOSIS — Z3482 Encounter for supervision of other normal pregnancy, second trimester: Secondary | ICD-10-CM

## 2017-08-23 DIAGNOSIS — K429 Umbilical hernia without obstruction or gangrene: Secondary | ICD-10-CM | POA: Insufficient documentation

## 2017-08-23 DIAGNOSIS — Z131 Encounter for screening for diabetes mellitus: Secondary | ICD-10-CM

## 2017-08-23 LAB — POCT URINALYSIS DIPSTICK
Glucose, UA: NEGATIVE
KETONES UA: NEGATIVE
NITRITE UA: NEGATIVE
PROTEIN UA: NEGATIVE

## 2017-08-23 NOTE — Patient Instructions (Signed)
Sheri Hill, I greatly value your feedback.  If you receive a survey following your visit with us today, we appreciate you taking the time to fill it out.  Thanks, Joellyn HaffKim Ketsia Linebaugh, CNM, WHNP-BC   Call the office [phone]) or go to [facility] if:  You begin to have strong, frequent contractions  Your water breaks.  Sometimes it is a big gush of fluid, sometimes it is just a trickle that keeps getting your panties wet or running down your legs  You have vaginal bleeding.  It is normal to have a small amount of spotting if your cervix was checked.   You don't feel your baby moving like normal.  If you don't, get you something to eat and drink and lay down and focus on feeling your baby move.  You should feel at least 10 movements in 2 hours.  If you don't, you should call the office or go to [facility].    Tdap Vaccine  It is recommended that you get the Tdap vaccine during the third trimester of EACH pregnancy to help protect your baby from getting pertussis (whooping cough)  27-36 weeks is the BEST time to do this so that you can pass the protection on to your baby. During pregnancy is better than after pregnancy, but if you are unable to get it during pregnancy it will be offered at the hospital.   You can get this vaccine at the health department or your family doctor  Everyone who will be around your baby should also be up-to-date on their vaccines. Adults (who are not pregnant) only need 1 dose of Tdap during adulthood.   Third Trimester of Pregnancy The third trimester is from week 29 through week 42, months 7 through 9. The third trimester is a time when the fetus is growing rapidly. At the end of the ninth month, the fetus is about 20 inches in length and weighs 6-10 pounds.  BODY CHANGES Your body goes through many changes during pregnancy. The changes vary from woman to woman.   Your weight will continue to increase. You can expect to gain 25-35 pounds (11-16 kg) by the  end of the pregnancy.  You may begin to get stretch marks on your hips, abdomen, and breasts.  You may urinate more often because the fetus is moving lower into your pelvis and pressing on your bladder.  You may develop or continue to have heartburn as a result of your pregnancy.  You may develop constipation because certain hormones are causing the muscles that push waste through your intestines to slow down.  You may develop hemorrhoids or swollen, bulging veins (varicose veins).  You may have pelvic pain because of the weight gain and pregnancy hormones relaxing your joints between the bones in your pelvis. Backaches may result from overexertion of the muscles supporting your posture.  You may have changes in your hair. These can include thickening of your hair, rapid growth, and changes in texture. Some women also have hair loss during or after pregnancy, or hair that feels dry or thin. Your hair will most likely return to normal after your baby is born.  Your breasts will continue to grow and be tender. A yellow discharge may leak from your breasts called colostrum.  Your belly button may stick out.  You may feel short of breath because of your expanding uterus.  You may notice the fetus "dropping," or moving lower in your abdomen.  You may have a bloody  mucus discharge. This usually occurs a few days to a week before labor begins.  Your cervix becomes thin and soft (effaced) near your due date. WHAT TO EXPECT AT YOUR PRENATAL EXAMS  You will have prenatal exams every 2 weeks until week 36. Then, you will have weekly prenatal exams. During a routine prenatal visit:  You will be weighed to make sure you and the fetus are growing normally.  Your blood pressure is taken.  Your abdomen will be measured to track your baby's growth.  The fetal heartbeat will be listened to.  Any test results from the previous visit will be discussed.  You may have a cervical check near your due  date to see if you have effaced. At around 36 weeks, your caregiver will check your cervix. At the same time, your caregiver will also perform a test on the secretions of the vaginal tissue. This test is to determine if a type of bacteria, Group B streptococcus, is present. Your caregiver will explain this further. Your caregiver may ask you:  What your birth plan is.  How you are feeling.  If you are feeling the baby move.  If you have had any abnormal symptoms, such as leaking fluid, bleeding, severe headaches, or abdominal cramping.  If you have any questions. Other tests or screenings that may be performed during your third trimester include:  Blood tests that check for low iron levels (anemia).  Fetal testing to check the health, activity level, and growth of the fetus. Testing is done if you have certain medical conditions or if there are problems during the pregnancy. FALSE LABOR You may feel small, irregular contractions that eventually go away. These are called Braxton Hicks contractions, or false labor. Contractions may last for hours, days, or even weeks before true labor sets in. If contractions come at regular intervals, intensify, or become painful, it is best to be seen by your caregiver.  SIGNS OF LABOR   Menstrual-like cramps.  Contractions that are 5 minutes apart or less.  Contractions that start on the top of the uterus and spread down to the lower abdomen and back.  A sense of increased pelvic pressure or back pain.  A watery or bloody mucus discharge that comes from the vagina. If you have any of these signs before the 37th week of pregnancy, call your caregiver right away. You need to go to the hospital to get checked immediately. HOME CARE INSTRUCTIONS   Avoid all smoking, herbs, alcohol, and unprescribed drugs. These chemicals affect the formation and growth of the baby.  Follow your caregiver's instructions regarding medicine use. There are medicines that  are either safe or unsafe to take during pregnancy.  Exercise only as directed by your caregiver. Experiencing uterine cramps is a good sign to stop exercising.  Continue to eat regular, healthy meals.  Wear a good support bra for breast tenderness.  Do not use hot tubs, steam rooms, or saunas.  Wear your seat belt at all times when driving.  Avoid raw meat, uncooked cheese, cat litter boxes, and soil used by cats. These carry germs that can cause birth defects in the baby.  Take your prenatal vitamins.  Try taking a stool softener (if your caregiver approves) if you develop constipation. Eat more high-fiber foods, such as fresh vegetables or fruit and whole grains. Drink plenty of fluids to keep your urine clear or pale yellow.  Take warm sitz baths to soothe any pain or discomfort caused by hemorrhoids.  Use hemorrhoid cream if your caregiver approves.  If you develop varicose veins, wear support hose. Elevate your feet for 15 minutes, 3-4 times a day. Limit salt in your diet.  Avoid heavy lifting, wear low heal shoes, and practice good posture.  Rest a lot with your legs elevated if you have leg cramps or low back pain.  Visit your dentist if you have not gone during your pregnancy. Use a soft toothbrush to brush your teeth and be gentle when you floss.  A sexual relationship may be continued unless your caregiver directs you otherwise.  Do not travel far distances unless it is absolutely necessary and only with the approval of your caregiver.  Take prenatal classes to understand, practice, and ask questions about the labor and delivery.  Make a trial run to the hospital.  Pack your hospital bag.  Prepare the baby's nursery.  Continue to go to all your prenatal visits as directed by your caregiver. SEEK MEDICAL CARE IF:  You are unsure if you are in labor or if your water has broken.  You have dizziness.  You have mild pelvic cramps, pelvic pressure, or nagging pain  in your abdominal area.  You have persistent nausea, vomiting, or diarrhea.  You have a bad smelling vaginal discharge.  You have pain with urination. SEEK IMMEDIATE MEDICAL CARE IF:   You have a fever.  You are leaking fluid from your vagina.  You have spotting or bleeding from your vagina.  You have severe abdominal cramping or pain.  You have rapid weight loss or gain.  You have shortness of breath with chest pain.  You notice sudden or extreme swelling of your face, hands, ankles, feet, or legs.  You have not felt your baby move in over an hour.  You have severe headaches that do not go away with medicine.  You have vision changes. Document Released: 05/26/2001 Document Revised: 06/06/2013 Document Reviewed: 08/02/2012 [facility] Patient Information 2015 [place], [place]. This information is not intended to replace advice given to you by your health care provider. Make sure you discuss any questions you have with your health care provider.

## 2017-08-23 NOTE — Progress Notes (Signed)
   LOW-RISK PREGNANCY VISIT Patient name: Sheri Hill MRN [id number]  Date of birth: [birth year] Chief Complaint:   Routine Prenatal Visit (PN2 today)  History of Present Illness:   Sheri Fantida A Rahn is a 27 y.o. 172P1001 female at 9516w2d with an Estimated Date of Delivery: 11/20/17 being seen today for ongoing management of a low-risk pregnancy.  Today she reports no complaints. Contractions: Not present. Vag. Bleeding: None.  Movement: Present. denies leaking of fluid. Review of Systems:   Pertinent items are noted in HPI Denies abnormal vaginal discharge w/ itching/odor/irritation, headaches, visual changes, shortness of breath, chest pain, abdominal pain, severe nausea/vomiting, or problems with urination or bowel movements unless otherwise stated above. Pertinent History Reviewed:  Reviewed past medical,surgical, social, obstetrical and family history.  Reviewed problem list, medications and allergies. Physical Assessment:   Vitals:   08/23/17 0949  BP: 108/60  Pulse: 79  Weight: 113 lb 8 oz (51.5 kg)  Body mass index is 19.48 kg/m.        Physical Examination:   General appearance: Well appearing, and in no distress  Mental status: Alert, oriented to person, place, and time  Skin: Warm & dry  Cardiovascular: Normal heart rate noted  Respiratory: Normal respiratory effort, no distress  Abdomen: Soft, gravid, nontender  Pelvic: Cervical exam deferred         Extremities: Edema: None  Fetal Status: Fetal Heart Rate (bpm): 148 Fundal Height: 23 cm Movement: Present    Results for orders placed or performed in visit on 08/23/17 (from the past 24 hour(s))  POCT urinalysis dipstick   Collection Time: 08/23/17  9:50 AM  Result Value Ref Range   Color, UA     Clarity, UA     Glucose, UA neg    Bilirubin, UA     Ketones, UA neg    Spec Grav, UA  1.010 - 1.025   Blood, UA trace    pH, UA  5.0 - 8.0   Protein, UA neg    Urobilinogen, UA  0.2 or 1.0 E.U./dL   Nitrite, UA neg    Leukocytes, UA Moderate (2+) (A) Negative   Appearance     Odor      Assessment & Plan:  1) Low-risk pregnancy G2P1001 at 1816w2d with an Estimated Date of Delivery: 11/20/17   2) Uterine s<d, will get efw/afi u/s   Meds: No orders of the defined types were placed in this encounter.  Labs/procedures today: pn2  Plan:  Continue routine obstetrical care   Reviewed: Preterm labor symptoms and general obstetric precautions including but not limited to vaginal bleeding, contractions, leaking of fluid and fetal movement were reviewed in detail with the patient. Recommended Tdap at HD/PCP per CDC guidelines.  All questions were answered  Follow-up: Return for asap for , US:EFW (no visit), then 3wks for LROB.  Orders Placed This Encounter  Procedures  . US OB Follow Up  . POCT urinalysis dipstick   Cheral MarkerKimberly R Malikhi Ogan CNM, [facility]NP-BC 08/23/2017 10:21 AM

## 2017-08-24 LAB — CBC
HEMOGLOBIN: 11.7 g/dL (ref 11.1–15.9)
Hematocrit: 34.3 % (ref 34.0–46.6)
MCH: 30.6 pg (ref 26.6–33.0)
MCHC: 34.1 g/dL (ref 31.5–35.7)
MCV: 90 fL (ref 79–97)
Platelets: 217 10*3/uL (ref 150–379)
RBC: 3.82 x10E6/uL (ref 3.77–5.28)
RDW: 13.5 % (ref 12.3–15.4)
WBC: 10.9 10*3/uL — AB (ref 3.4–10.8)

## 2017-08-24 LAB — HIV ANTIBODY (ROUTINE TESTING W REFLEX): HIV Screen 4th Generation wRfx: NONREACTIVE

## 2017-08-24 LAB — GLUCOSE TOLERANCE, 2 HOURS W/ 1HR
GLUCOSE, 1 HOUR: 122 mg/dL (ref 65–179)
GLUCOSE, FASTING: 75 mg/dL (ref 65–91)
Glucose, 2 hour: 126 mg/dL (ref 65–152)

## 2017-08-24 LAB — RPR: RPR: NONREACTIVE

## 2017-08-24 LAB — ANTIBODY SCREEN: ANTIBODY SCREEN: NEGATIVE

## 2017-08-25 ENCOUNTER — Ambulatory Visit (INDEPENDENT_AMBULATORY_CARE_PROVIDER_SITE_OTHER): Payer: Medicaid Other

## 2017-08-25 DIAGNOSIS — O26842 Uterine size-date discrepancy, second trimester: Secondary | ICD-10-CM | POA: Diagnosis not present

## 2017-08-25 DIAGNOSIS — Z3A27 27 weeks gestation of pregnancy: Secondary | ICD-10-CM | POA: Diagnosis not present

## 2017-08-25 DIAGNOSIS — O0932 Supervision of pregnancy with insufficient antenatal care, second trimester: Secondary | ICD-10-CM

## 2017-08-25 DIAGNOSIS — Z3482 Encounter for supervision of other normal pregnancy, second trimester: Secondary | ICD-10-CM

## 2017-08-25 NOTE — Progress Notes (Signed)
US 27+4 wks,transverse head right,anterior pl gr 0,cx 4.1 cm,AFI 17 cm,fhr 135 bpm,bilat adnexa's wnl,EFW 1180 g 60%

## 2017-08-31 ENCOUNTER — Encounter (HOSPITAL_COMMUNITY): Payer: Self-pay | Admitting: *Deleted

## 2017-08-31 ENCOUNTER — Inpatient Hospital Stay (HOSPITAL_COMMUNITY)
Admission: AD | Admit: 2017-08-31 | Discharge: 2017-09-01 | Disposition: A | Discharge status: Home / Self Care | Payer: Medicaid Other | Source: Ambulatory Visit | Attending: Family Medicine | Admitting: Family Medicine

## 2017-08-31 DIAGNOSIS — G43119 Migraine with aura, intractable, without status migrainosus: Secondary | ICD-10-CM | POA: Diagnosis not present

## 2017-08-31 DIAGNOSIS — R51 Headache: Secondary | ICD-10-CM | POA: Diagnosis present

## 2017-08-31 DIAGNOSIS — O9989 Other specified diseases and conditions complicating pregnancy, childbirth and the puerperium: Secondary | ICD-10-CM | POA: Diagnosis not present

## 2017-08-31 DIAGNOSIS — Z3A28 28 weeks gestation of pregnancy: Secondary | ICD-10-CM | POA: Insufficient documentation

## 2017-08-31 DIAGNOSIS — O99353 Diseases of the nervous system complicating pregnancy, third trimester: Secondary | ICD-10-CM | POA: Insufficient documentation

## 2017-08-31 DIAGNOSIS — O26893 Other specified pregnancy related conditions, third trimester: Secondary | ICD-10-CM | POA: Diagnosis present

## 2017-08-31 LAB — PROTEIN / CREATININE RATIO, URINE
Creatinine, Urine: 52 mg/dL
Protein Creatinine Ratio: 0.15 mg/mg{Cre} (ref 0.00–0.15)
Total Protein, Urine: 8 mg/dL

## 2017-08-31 LAB — URINALYSIS, ROUTINE W REFLEX MICROSCOPIC
Bilirubin Urine: NEGATIVE
Glucose, UA: NEGATIVE mg/dL
Hgb urine dipstick: NEGATIVE
Ketones, ur: 5 mg/dL — AB
Nitrite: NEGATIVE
Protein, ur: NEGATIVE mg/dL
Specific Gravity, Urine: 1.01 (ref 1.005–1.030)
pH: 6 (ref 5.0–8.0)

## 2017-08-31 LAB — COMPREHENSIVE METABOLIC PANEL
ALT: 14 U/L (ref 14–54)
AST: 18 U/L (ref 15–41)
Albumin: 3.4 g/dL — ABNORMAL LOW (ref 3.5–5.0)
Alkaline Phosphatase: 101 U/L (ref 38–126)
Anion gap: 9 (ref 5–15)
BUN: 9 mg/dL (ref 6–20)
CO2: 23 mmol/L (ref 22–32)
Calcium: 9 mg/dL (ref 8.9–10.3)
Chloride: 104 mmol/L (ref 101–111)
Creatinine, Ser: 0.44 mg/dL (ref 0.44–1.00)
GFR calc Af Amer: >60 mL/min (ref >60)
GFR calc non Af Amer: >60 mL/min (ref >60)
Glucose, Bld: 97 mg/dL (ref 65–99)
Potassium: 3.4 mmol/L — ABNORMAL LOW (ref 3.5–5.1)
Sodium: 136 mmol/L (ref 135–145)
Total Bilirubin: 0.4 mg/dL (ref 0.3–1.2)
Total Protein: 6.8 g/dL (ref 6.5–8.1)

## 2017-08-31 LAB — CBC
HCT: 32.8 % — ABNORMAL LOW (ref 36.0–46.0)
Hemoglobin: 11.1 g/dL — ABNORMAL LOW (ref 12.0–15.0)
MCH: 30.8 pg (ref 26.0–34.0)
MCHC: 33.8 g/dL (ref 30.0–36.0)
MCV: 91.1 fL (ref 78.0–100.0)
Platelets: 202 10*3/uL (ref 150–400)
RBC: 3.6 MIL/uL — ABNORMAL LOW (ref 3.87–5.11)
RDW: 13.4 % (ref 11.5–15.5)
WBC: 10.4 10*3/uL (ref 4.0–10.5)

## 2017-08-31 LAB — POCT PREGNANCY, URINE: Preg Test, Ur: POSITIVE — AB

## 2017-08-31 MED ORDER — ACETAMINOPHEN 325 MG PO TABS
650.0000 mg | ORAL_TABLET | Freq: Once | ORAL | Status: AC
  Administered 2017-08-31: 650 mg via ORAL
  Filled 2017-08-31: qty 2

## 2017-08-31 MED ORDER — BUTALBITAL-APAP-CAFFEINE 50-325-40 MG PO TABS
1.0000 | ORAL_TABLET | Freq: Once | ORAL | Status: AC
  Administered 2017-08-31: 1 via ORAL
  Filled 2017-08-31: qty 1

## 2017-08-31 NOTE — MAU Note (Signed)
Seeing dark spots last night and tonight. BP wnl also c/o of heachaches. No bleeding or leakage of fluid

## 2017-08-31 NOTE — MAU Provider Note (Signed)
Chief Complaint:  Headache   First Provider Initiated Contact with Patient 08/31/17 2201      HPI: Sheri Hill is a 27 y.o. G2P1001 at 88w3dwho presents to maternity admissions reporting HA and vision changes. She reports HA that started last night, improved with rest and then got worse today, rates pain 9/10 - has not taken anything for HA. She reports vision changes of "black spots" is associated with the HA. Denies concerns or problems with pregnancy. She denies BP issues during pregnancy. She denies abdominal pain or cramping. She reports good fetal movement, denies LOF, vaginal bleeding, vaginal itching/burning, urinary symptoms, dizziness, n/v, or fever/chills.    Past Medical History: Past Medical History:  Diagnosis Date  . Medical history non-contributory     Past obstetric history: OB History  Gravida Para Term Preterm AB Living  2 1 1     1   SAB TAB Ectopic Multiple Live Births          1    # Outcome Date GA Lbr Len/2nd Weight Sex Delivery Anes PTL Lv  2 Current           1 Term 12/09/15 [redacted]w[redacted]d  6 lb 8 oz (2.948 kg) F Vag-Spont None N LIV      Past Surgical History: Past Surgical History:  Procedure Laterality Date  . NO PAST SURGERIES      Family History: Family History  Problem Relation Age of Onset  . Cancer Neg Hx   . Diabetes Neg Hx   . Heart failure Neg Hx     Social History: Social History   Tobacco Use  . Smoking status: Never Smoker  . Smokeless tobacco: Never Used  Substance Use Topics  . Alcohol use: No  . Drug use: No    Allergies: No Known Allergies  Meds:  Medications Prior to Admission  Medication Sig Dispense Refill Last Dose  . Prenatal Vit-Fe Fumarate-FA (PRENATAL MULTIVITAMIN) TABS tablet Take 1 tablet by mouth daily at 12 noon.   Taking    ROS:  Review of Systems  Constitutional: Negative.   Eyes: Positive for visual disturbance. Negative for pain.  Respiratory: Negative.   Cardiovascular: Negative.   Gastrointestinal:  Negative.   Genitourinary: Negative.   Musculoskeletal: Negative.   Neurological: Positive for headaches. Negative for dizziness, weakness and light-headedness.  Psychiatric/Behavioral: Negative.    I have reviewed patient's Past Medical Hx, Surgical Hx, Family Hx, Social Hx, medications and allergies.   Physical Exam   Patient Vitals for the past 24 hrs:  BP Temp Temp src Pulse Resp SpO2 Weight  08/31/17 2203 (!) 113/59 - - 91 - - -  08/31/17 2121 126/61 98.3 F (36.8 C) Oral 79 17 99 % 119 lb (54 kg)   Constitutional: Well-developed, well-nourished female in no acute distress.  Cardiovascular: normal rate Respiratory: normal effort GI: Abd soft, non-tender, gravid appropriate for gestational age.  MS: Extremities nontender, no edema, normal ROM Neurologic: Alert and oriented x 4.  GU: Neg CVAT. PELVIC EXAM: deferred  FHT:  Baseline 125 , moderate variability, accelerations present, no decelerations Contractions: none   Labs: Results for orders placed or performed during the hospital encounter of 08/31/17 (from the past 24 hour(s))  Urinalysis, Routine w reflex microscopic     Status: Abnormal   Collection Time: 08/31/17  9:25 PM  Result Value Ref Range   Color, Urine YELLOW YELLOW   APPearance CLOUDY (A) CLEAR   Specific Gravity, Urine 1.010 1.005 - 1.030  pH 6.0 5.0 - 8.0   Glucose, UA NEGATIVE NEGATIVE mg/dL   Hgb urine dipstick NEGATIVE NEGATIVE   Bilirubin Urine NEGATIVE NEGATIVE   Ketones, ur 5 (A) NEGATIVE mg/dL   Protein, ur NEGATIVE NEGATIVE mg/dL   Nitrite NEGATIVE NEGATIVE   Leukocytes, UA LARGE (A) NEGATIVE   RBC / HPF 6-30 0 - 5 RBC/hpf   WBC, UA 6-30 0 - 5 WBC/hpf   Bacteria, UA RARE (A) NONE SEEN   Squamous Epithelial / LPF 6-30 (A) NONE SEEN   Mucus PRESENT   Protein / creatinine ratio, urine     Status: None   Collection Time: 08/31/17  9:25 PM  Result Value Ref Range   Creatinine, Urine 52.00 mg/dL   Total Protein, Urine 8 mg/dL   Protein  Creatinine Ratio 0.15 0.00 - 0.15 mg/mg[Cre]  CBC     Status: Abnormal   Collection Time: 08/31/17  9:42 PM  Result Value Ref Range   WBC 10.4 4.0 - 10.5 K/uL   RBC 3.60 (L) 3.87 - 5.11 MIL/uL   Hemoglobin 11.1 (L) 12.0 - 15.0 g/dL   HCT 16.1 (L) 09.6 - 04.5 %   MCV 91.1 78.0 - 100.0 fL   MCH 30.8 26.0 - 34.0 pg   MCHC 33.8 30.0 - 36.0 g/dL   RDW 40.9 81.1 - 91.4 %   Platelets 202 150 - 400 K/uL  Comprehensive metabolic panel     Status: Abnormal   Collection Time: 08/31/17  9:42 PM  Result Value Ref Range   Sodium 136 135 - 145 mmol/L   Potassium 3.4 (L) 3.5 - 5.1 mmol/L   Chloride 104 101 - 111 mmol/L   CO2 23 22 - 32 mmol/L   Glucose, Bld 97 65 - 99 mg/dL   BUN 9 6 - 20 mg/dL   Creatinine, Ser 7.82 0.44 - 1.00 mg/dL   Calcium 9.0 8.9 - 95.6 mg/dL   Total Protein 6.8 6.5 - 8.1 g/dL   Albumin 3.4 (L) 3.5 - 5.0 g/dL   AST 18 15 - 41 U/L   ALT 14 14 - 54 U/L   Alkaline Phosphatase 101 38 - 126 U/L   Total Bilirubin 0.4 0.3 - 1.2 mg/dL   GFR calc non Af Amer >60 >60 mL/min   GFR calc Af Amer >60 >60 mL/min   Anion gap 9 5 - 15  Pregnancy, urine POC     Status: Abnormal   Collection Time: 08/31/17  9:52 PM  Result Value Ref Range   Preg Test, Ur POSITIVE (A) NEGATIVE   O/Positive/-- (02/11 1455)  MAU Course/MDM: Orders Placed This Encounter  Procedures  . Urinalysis, Routine w reflex microscopic  . CBC  . Comprehensive metabolic panel  . Protein / creatinine ratio, urine  . Pregnancy, urine POC  UA- dirty catch otherwise WNL Pre E labs- negative   Meds ordered this encounter  Medications  . acetaminophen (TYLENOL) tablet 650 mg  . butalbital-acetaminophen-caffeine (FIORICET, ESGIC) 50-325-40 MG per tablet 1 tablet  . butalbital-acetaminophen-caffeine (FIORICET, ESGIC) 50-325-40 MG tablet    Sig: Take 1 tablet by mouth every 6 (six) hours as needed for headache.    Dispense:  20 tablet    Refill:  0    Order Specific Question:   Supervising Provider    Answer:    Levie Heritage [[id number]]   NST reviewed- reactive for gestational age  Treatments in MAU included 650mg  Tylenol with little relief of HA, Fioricet 1 tablet- pt reports  significant relief of HA with medication treatment, currently rates HA down from 9 to 3 with Fioricet. She denies visual changes since presenting to MAU .   Pt discharge. Pt stable prior to discharge.   Assessment: 1. Intractable migraine with aura without status migrainosus   2. [redacted] weeks gestation of pregnancy     Plan: Discharge home Preterm Labor precautions and fetal kick counts Rx for Fioricet  Follow up as scheduled in the office for prenatal appointments    Allergies as of 09/01/2017   No Known Allergies     Medication List    TAKE these medications   butalbital-acetaminophen-caffeine 50-325-40 MG tablet Commonly known as:  FIORICET, ESGIC Take 1 tablet by mouth every 6 (six) hours as needed for headache.   prenatal multivitamin Tabs tablet Take 1 tablet by mouth daily at 12 noon.       Steward DroneVeronica Janiesha Hill Certified Nurse-Midwife 09/01/2017 2:33 AM

## 2017-09-01 DIAGNOSIS — G43119 Migraine with aura, intractable, without status migrainosus: Secondary | ICD-10-CM | POA: Diagnosis not present

## 2017-09-01 DIAGNOSIS — Z3A28 28 weeks gestation of pregnancy: Secondary | ICD-10-CM

## 2017-09-01 DIAGNOSIS — O9989 Other specified diseases and conditions complicating pregnancy, childbirth and the puerperium: Secondary | ICD-10-CM | POA: Diagnosis not present

## 2017-09-01 MED ORDER — BUTALBITAL-APAP-CAFFEINE 50-325-40 MG PO TABS: 1.0000 | ORAL_TABLET | Freq: Four times a day (QID) | ORAL | 0 refills | Status: DC | PRN

## 2017-09-01 NOTE — Discharge Instructions (Signed)
Migraine Headache A migraine headache is an intense, throbbing pain on one side or both sides of the head. Migraines may also cause other symptoms, such as nausea, vomiting, and sensitivity to light and noise. What are the causes? Doing or taking certain things may also trigger migraines, such as:  Alcohol.  Smoking.  Medicines, such as: ? Medicine used to treat chest pain (nitroglycerine). ? Birth control pills. ? Estrogen pills. ? Certain blood pressure medicines.  Aged cheeses, chocolate, or caffeine.  Foods or drinks that contain nitrates, glutamate, aspartame, or tyramine.  Physical activity.  Other things that may trigger a migraine include:  Menstruation.  Pregnancy.  Hunger.  Stress, lack of sleep, too much sleep, or fatigue.  Weather changes.  What increases the risk? The following factors may make you more likely to experience migraine headaches:  Age. Risk increases with age.  Family history of migraine headaches.  Being Caucasian.  Depression and anxiety.  Obesity.  Being a woman.  Having a hole in the heart (patent foramen ovale) or other heart problems.  What are the signs or symptoms? The main symptom of this condition is pulsating or throbbing pain. Pain may:  Happen in any area of the head, such as on one side or both sides.  Interfere with daily activities.  Get worse with physical activity.  Get worse with exposure to bright lights or loud noises.  Other symptoms may include:  Nausea.  Vomiting.  Dizziness.  General sensitivity to bright lights, loud noises, or smells.  Before you get a migraine, you may get warning signs that a migraine is developing (aura). An aura may include:  Seeing flashing lights or having blind spots.  Seeing bright spots, halos, or zigzag lines.  Having tunnel vision or blurred vision.  Having numbness or a tingling feeling.  Having trouble talking.  Having muscle weakness.  How is this  diagnosed? A migraine headache can be diagnosed based on:  Your symptoms.  A physical exam.  Tests, such as CT scan or MRI of the head. These imaging tests can help rule out other causes of headaches.  Taking fluid from the spine (lumbar puncture) and analyzing it (cerebrospinal fluid analysis, or CSF analysis).  How is this treated? A migraine headache is usually treated with medicines that:  Relieve pain.  Relieve nausea.  Prevent migraines from coming back.  Treatment may also include:  Acupuncture.  Lifestyle changes like avoiding foods that trigger migraines.  Follow these instructions at home: Medicines  Take over-the-counter and prescription medicines only as told by your health care provider.  Do not drive or use heavy machinery while taking prescription pain medicine.  To prevent or treat constipation while you are taking prescription pain medicine, your health care provider may recommend that you: ? Drink enough fluid to keep your urine clear or pale yellow. ? Take over-the-counter or prescription medicines. ? Eat foods that are high in fiber, such as fresh fruits and vegetables, whole grains, and beans. ? Limit foods that are high in fat and processed sugars, such as fried and sweet foods. Lifestyle  Avoid alcohol use.  Do not use any products that contain nicotine or tobacco, such as cigarettes and e-cigarettes. If you need help quitting, ask your health care provider.  Get at least 8 hours of sleep every night.  Limit your stress. General instructions   Keep a journal to find out what may trigger your migraine headaches. For example, write down: ? What you eat and   drink. ? How much sleep you get. ? Any change to your diet or medicines.  If you have a migraine: ? Avoid things that make your symptoms worse, such as bright lights. ? It may help to lie down in a dark, quiet room. ? Do not drive or use heavy machinery. ? Ask your health care provider  what activities are safe for you while you are experiencing symptoms.  Keep all follow-up visits as told by your health care provider. This is important. Contact a health care provider if:  You develop symptoms that are different or more severe than your usual migraine symptoms. Get help right away if:  Your migraine becomes severe.  You have a fever.  You have a stiff neck.  You have vision loss.  Your muscles feel weak or like you cannot control them.  You start to lose your balance often.  You develop trouble walking.  You faint. This information is not intended to replace advice given to you by your health care provider. Make sure you discuss any questions you have with your health care provider. Document Released: 06/01/2005 Document Revised: 12/20/2015 Document Reviewed: 11/18/2015 Elsevier Interactive Patient Education  2017 [employer]   

## 2017-09-14 ENCOUNTER — Encounter: Payer: Self-pay | Admitting: Obstetrics & Gynecology

## 2017-09-14 ENCOUNTER — Ambulatory Visit (INDEPENDENT_AMBULATORY_CARE_PROVIDER_SITE_OTHER): Payer: Medicaid Other | Admitting: Obstetrics & Gynecology

## 2017-09-14 ENCOUNTER — Encounter: Payer: Medicaid Other | Admitting: Women's Health

## 2017-09-14 VITALS — BP 104/66 | HR 78 | Wt 118.0 lb

## 2017-09-14 DIAGNOSIS — Z3A30 30 weeks gestation of pregnancy: Secondary | ICD-10-CM

## 2017-09-14 DIAGNOSIS — Z1389 Encounter for screening for other disorder: Secondary | ICD-10-CM

## 2017-09-14 DIAGNOSIS — Z331 Pregnant state, incidental: Secondary | ICD-10-CM

## 2017-09-14 DIAGNOSIS — Z3483 Encounter for supervision of other normal pregnancy, third trimester: Secondary | ICD-10-CM

## 2017-09-14 LAB — POCT URINALYSIS DIPSTICK
Glucose, UA: NEGATIVE
Protein, UA: NEGATIVE

## 2017-09-14 NOTE — Progress Notes (Signed)
G2P1001 201w3d Estimated Date of Delivery: 11/20/17  Blood pressure 104/66, pulse 78, weight 118 lb (53.5 kg), last menstrual period 02/13/2017, unknown if currently breastfeeding.   BP weight and urine results all reviewed and noted.  Please refer to the obstetrical flow sheet for the fundal height and fetal heart rate documentation:  Patient reports good fetal movement, denies any bleeding and no rupture of membranes symptoms or regular contractions. Patient is without complaints. All questions were answered.  Orders Placed This Encounter  Procedures  . POCT Urinalysis Dipstick    Plan:  Continued routine obstetrical care,   Return in about 3 weeks (around 10/05/2017) for LROB.

## 2017-10-05 ENCOUNTER — Ambulatory Visit (INDEPENDENT_AMBULATORY_CARE_PROVIDER_SITE_OTHER): Payer: Medicaid Other | Admitting: Obstetrics & Gynecology

## 2017-10-05 ENCOUNTER — Encounter: Payer: Self-pay | Admitting: Obstetrics & Gynecology

## 2017-10-05 VITALS — BP 110/60 | HR 98 | Wt 117.0 lb

## 2017-10-05 DIAGNOSIS — Z3A33 33 weeks gestation of pregnancy: Secondary | ICD-10-CM

## 2017-10-05 DIAGNOSIS — O365931 Maternal care for other known or suspected poor fetal growth, third trimester, fetus 1: Secondary | ICD-10-CM

## 2017-10-05 DIAGNOSIS — Z3483 Encounter for supervision of other normal pregnancy, third trimester: Secondary | ICD-10-CM

## 2017-10-05 DIAGNOSIS — Z1389 Encounter for screening for other disorder: Secondary | ICD-10-CM

## 2017-10-05 DIAGNOSIS — Z331 Pregnant state, incidental: Secondary | ICD-10-CM

## 2017-10-05 DIAGNOSIS — O365930 Maternal care for other known or suspected poor fetal growth, third trimester, not applicable or unspecified: Secondary | ICD-10-CM

## 2017-10-05 LAB — POCT URINALYSIS DIPSTICK
Blood, UA: NEGATIVE
GLUCOSE UA: NEGATIVE
Ketones, UA: NEGATIVE
Leukocytes, UA: NEGATIVE
NITRITE UA: NEGATIVE
Protein, UA: NEGATIVE

## 2017-10-05 NOTE — Progress Notes (Signed)
G2P1001 5452w3d Estimated Date of Delivery: 11/20/17  Blood pressure 110/60, pulse 98, weight 117 lb (53.1 kg), last menstrual period 02/13/2017, unknown if currently breastfeeding.   BP weight and urine results all reviewed and noted.  Please refer to the obstetrical flow sheet for the fundal height and fetal heart rate documentation:  Patient reports good fetal movement, denies any bleeding and no rupture of membranes symptoms or regular contractions. Patient is without complaints. All questions were answered.  Orders Placed This Encounter  Procedures  . US OB Follow Up  . POCT urinalysis dipstick    Plan:  Continued routine obstetrical care, no FH growth since last visit, will check sonogram to track EFW  Return in about 1 week (around 10/12/2017) for US for growth, LROB.

## 2017-10-12 ENCOUNTER — Ambulatory Visit (INDEPENDENT_AMBULATORY_CARE_PROVIDER_SITE_OTHER): Payer: Medicaid Other | Admitting: Women's Health

## 2017-10-12 ENCOUNTER — Encounter: Payer: Self-pay | Admitting: Women's Health

## 2017-10-12 ENCOUNTER — Ambulatory Visit (INDEPENDENT_AMBULATORY_CARE_PROVIDER_SITE_OTHER): Payer: Medicaid Other

## 2017-10-12 ENCOUNTER — Other Ambulatory Visit: Payer: Self-pay

## 2017-10-12 VITALS — BP 90/64 | HR 85 | Wt 119.0 lb

## 2017-10-12 DIAGNOSIS — O365930 Maternal care for other known or suspected poor fetal growth, third trimester, not applicable or unspecified: Secondary | ICD-10-CM | POA: Diagnosis not present

## 2017-10-12 DIAGNOSIS — Z3A34 34 weeks gestation of pregnancy: Secondary | ICD-10-CM | POA: Diagnosis not present

## 2017-10-12 DIAGNOSIS — Z1389 Encounter for screening for other disorder: Secondary | ICD-10-CM

## 2017-10-12 DIAGNOSIS — O365931 Maternal care for other known or suspected poor fetal growth, third trimester, fetus 1: Secondary | ICD-10-CM

## 2017-10-12 DIAGNOSIS — Z331 Pregnant state, incidental: Secondary | ICD-10-CM

## 2017-10-12 DIAGNOSIS — Z3483 Encounter for supervision of other normal pregnancy, third trimester: Secondary | ICD-10-CM

## 2017-10-12 DIAGNOSIS — O26843 Uterine size-date discrepancy, third trimester: Secondary | ICD-10-CM

## 2017-10-12 DIAGNOSIS — Z3403 Encounter for supervision of normal first pregnancy, third trimester: Secondary | ICD-10-CM

## 2017-10-12 DIAGNOSIS — O0932 Supervision of pregnancy with insufficient antenatal care, second trimester: Secondary | ICD-10-CM

## 2017-10-12 LAB — POCT URINALYSIS DIPSTICK
Blood, UA: NEGATIVE
Glucose, UA: NEGATIVE
Ketones, UA: NEGATIVE
Leukocytes, UA: NEGATIVE
Nitrite, UA: NEGATIVE
Protein, UA: NEGATIVE

## 2017-10-12 NOTE — Progress Notes (Signed)
   LOW-RISK PREGNANCY VISIT Patient name: Sheri Hill MRN [id number]  Date of birth: 04/30/1991 Chief Complaint:   Routine Prenatal Visit (u/s today)  History of Present Illness:   Sheri Hill is a 27 y.o. G82P1001 female at [redacted]w[redacted]d with an Estimated Date of Delivery: 11/20/17 being seen today for ongoing management of a low-risk pregnancy.  Today she reports no complaints. Contractions: Irregular. Vag. Bleeding: None.  Movement: Present. denies leaking of fluid. Review of Systems:   Pertinent items are noted in HPI Denies abnormal vaginal discharge w/ itching/odor/irritation, headaches, visual changes, shortness of breath, chest pain, abdominal pain, severe nausea/vomiting, or problems with urination or bowel movements unless otherwise stated above. Pertinent History Reviewed:  Reviewed past medical,surgical, social, obstetrical and family history.  Reviewed problem list, medications and allergies. Physical Assessment:   Vitals:   10/12/17 1344  BP: 90/64  Pulse: 85  Weight: 119 lb (54 kg)  Body mass index is 20.43 kg/m.        Physical Examination:   General appearance: Well appearing, and in no distress  Mental status: Alert, oriented to person, place, and time  Skin: Warm & dry  Cardiovascular: Normal heart rate noted  Respiratory: Normal respiratory effort, no distress  Abdomen: Soft, gravid, nontender  Pelvic: Cervical exam deferred         Extremities: Edema: None  Fetal Status: Fetal Heart Rate (bpm): 145 Fundal Height: 30 cm Movement: Present    Results for orders placed or performed in visit on 10/12/17 (from the past 24 hour(s))  POCT urinalysis dipstick   Collection Time: 10/12/17  1:46 PM  Result Value Ref Range   Color, UA     Clarity, UA     Glucose, UA neg    Bilirubin, UA     Ketones, UA neg    Spec Grav, UA  1.010 - 1.025   Blood, UA neg    pH, UA  5.0 - 8.0   Protein, UA neg    Urobilinogen, UA  0.2 or 1.0 E.U./dL   Nitrite, UA neg    Leukocytes, UA  Negative Negative   Appearance     Odor      Assessment & Plan:  1) Low-risk pregnancy G2P1001 at [redacted]w[redacted]d with an Estimated Date of Delivery: 11/20/17   2) Persistent uterine size < dates, u/s today normal afi and efw   Meds: No orders of the defined types were placed in this encounter.  Labs/procedures today: u/s  Plan:  Continue routine obstetrical care   Reviewed: Preterm labor symptoms and general obstetric precautions including but not limited to vaginal bleeding, contractions, leaking of fluid and fetal movement were reviewed in detail with the patient.  All questions were answered  Follow-up: Return in about 2 weeks (around 10/27/2017) for LROB. and gbs, gc/ct  Orders Placed This Encounter  Procedures  . POCT urinalysis dipstick   Cheral Marker CNM, [facility] 10/12/2017 2:09 PM

## 2017-10-12 NOTE — Patient Instructions (Addendum)
Sheri Hill, I greatly value your feedback.  If you receive a survey following your visit with [place] today, we appreciate you taking the time to fill it out.  Thanks, Sheri Hill, CNM, WHNP-BC   Call the office [phone]) or go to [facility] if:  You begin to have strong, frequent contractions  Your water breaks.  Sometimes it is a big gush of fluid, sometimes it is just a trickle that keeps getting your panties wet or running down your legs  You have vaginal bleeding.  It is normal to have a small amount of spotting if your cervix was checked.   You don't feel your baby moving like normal.  If you don't, get you something to eat and drink and lay down and focus on feeling your baby move.  You should feel at least 10 movements in 2 hours.  If you don't, you should call the office or go to [facility].     Preterm Labor and Birth Information The normal length of a pregnancy is 39-41 weeks. Preterm labor is when labor starts before 37 completed weeks of pregnancy. What are the risk factors for preterm labor? Preterm labor is more likely to occur in women who:  Have certain infections during pregnancy such as a bladder infection, sexually transmitted infection, or infection inside the uterus (chorioamnionitis).  Have a shorter-than-normal cervix.  Have gone into preterm labor before.  Have had surgery on their cervix.  Are younger than age 16 or older than age 48.  Are African American.  Are pregnant with twins or multiple babies (multiple gestation).  Take street drugs or smoke while pregnant.  Do not gain enough weight while pregnant.  Became pregnant shortly after having been pregnant.  What are the symptoms of preterm labor? Symptoms of preterm labor include:  Cramps similar to those that can happen during a menstrual period. The cramps may happen with diarrhea.  Pain in the abdomen or lower back.  Regular uterine contractions that may feel like tightening of  the abdomen.  A feeling of increased pressure in the pelvis.  Increased watery or bloody mucus discharge from the vagina.  Water breaking (ruptured amniotic sac).  Why is it important to recognize signs of preterm labor? It is important to recognize signs of preterm labor because babies who are born prematurely may not be fully developed. This can put them at an increased risk for:  Long-term (chronic) heart and lung problems.  Difficulty immediately after birth with regulating body systems, including blood sugar, body temperature, heart rate, and breathing rate.  Bleeding in the brain.  Cerebral palsy.  Learning difficulties.  Death.  These risks are highest for babies who are born before 34 weeks of pregnancy. How is preterm labor treated? Treatment depends on the length of your pregnancy, your condition, and the health of your baby. It may involve:  Having a stitch (suture) placed in your cervix to prevent your cervix from opening too early (cerclage).  Taking or being given medicines, such as: ? Hormone medicines. These may be given early in pregnancy to help support the pregnancy. ? Medicine to stop contractions. ? Medicines to help mature the baby's lungs. These may be prescribed if the risk of delivery is high. ? Medicines to prevent your baby from developing cerebral palsy.  If the labor happens before 34 weeks of pregnancy, you may need to stay in the hospital. What should I do if I think I am in preterm labor? If  you think that you are going into preterm labor, call your health care provider right away. How can I prevent preterm labor in future pregnancies? To increase your chance of having a full-term pregnancy:  Do not use any tobacco products, such as cigarettes, chewing tobacco, and e-cigarettes. If you need help quitting, ask your health care provider.  Do not use street drugs or medicines that have not been prescribed to you during your pregnancy.  Talk  with your health care provider before taking any herbal supplements, even if you have been taking them regularly.  Make sure you gain a healthy amount of weight during your pregnancy.  Watch for infection. If you think that you might have an infection, get it checked right away.  Make sure to tell your health care provider if you have gone into preterm labor before.  This information is not intended to replace advice given to you by your health care provider. Make sure you discuss any questions you have with your health care provider. Document Released: 08/22/2003 Document Revised: 11/12/2015 Document Reviewed: 10/23/2015 Elsevier Interactive Patient Education  2018 [employer] Sheri Hill, I greatly value your feedback.  If you receive a survey following your visit with [place] today, we appreciate you taking the time to fill it out.  Thanks, Sheri Hill, CNM, WHNP-BC   Call the office [phone]) or go to [facility] if:  You begin to have strong, frequent contractions  Your water breaks.  Sometimes it is a big gush of fluid, sometimes it is just a trickle that keeps getting your panties wet or running down your legs  You have vaginal bleeding.  It is normal to have a small amount of spotting if your cervix was checked.   You don't feel your baby moving like normal.  If you don't, get you something to eat and drink and lay down and focus on feeling your baby move.  You should feel at least 10 movements in 2 hours.  If you don't, you should call the office or go to [facility].     Preterm Labor and Birth Information The normal length of a pregnancy is 39-41 weeks. Preterm labor is when labor starts before 37 completed weeks of pregnancy. What are the risk factors for preterm labor? Preterm labor is more likely to occur in women who:  Have certain infections during pregnancy such as a bladder infection, sexually transmitted infection, or infection inside the uterus  (chorioamnionitis).  Have a shorter-than-normal cervix.  Have gone into preterm labor before.  Have had surgery on their cervix.  Are younger than age 35 or older than age 35.  Are African American.  Are pregnant with twins or multiple babies (multiple gestation).  Take street drugs or smoke while pregnant.  Do not gain enough weight while pregnant.  Became pregnant shortly after having been pregnant.  What are the symptoms of preterm labor? Symptoms of preterm labor include:  Cramps similar to those that can happen during a menstrual period. The cramps may happen with diarrhea.  Pain in the abdomen or lower back.  Regular uterine contractions that may feel like tightening of the abdomen.  A feeling of increased pressure in the pelvis.  Increased watery or bloody mucus discharge from the vagina.  Water breaking (ruptured amniotic sac).  Why is it important to recognize signs of preterm labor? It is important to recognize signs of preterm labor because babies who are born prematurely may not be fully developed. This  can put them at an increased risk for:  Long-term (chronic) heart and lung problems.  Difficulty immediately after birth with regulating body systems, including blood sugar, body temperature, heart rate, and breathing rate.  Bleeding in the brain.  Cerebral palsy.  Learning difficulties.  Death.  These risks are highest for babies who are born before 34 weeks of pregnancy. How is preterm labor treated? Treatment depends on the length of your pregnancy, your condition, and the health of your baby. It may involve:  Having a stitch (suture) placed in your cervix to prevent your cervix from opening too early (cerclage).  Taking or being given medicines, such as: ? Hormone medicines. These may be given early in pregnancy to help support the pregnancy. ? Medicine to stop contractions. ? Medicines to help mature the baby's lungs. These may be prescribed  if the risk of delivery is high. ? Medicines to prevent your baby from developing cerebral palsy.  If the labor happens before 34 weeks of pregnancy, you may need to stay in the hospital. What should I do if I think I am in preterm labor? If you think that you are going into preterm labor, call your health care provider right away. How can I prevent preterm labor in future pregnancies? To increase your chance of having a full-term pregnancy:  Do not use any tobacco products, such as cigarettes, chewing tobacco, and e-cigarettes. If you need help quitting, ask your health care provider.  Do not use street drugs or medicines that have not been prescribed to you during your pregnancy.  Talk with your health care provider before taking any herbal supplements, even if you have been taking them regularly.  Make sure you gain a healthy amount of weight during your pregnancy.  Watch for infection. If you think that you might have an infection, get it checked right away.  Make sure to tell your health care provider if you have gone into preterm labor before.  This information is not intended to replace advice given to you by your health care provider. Make sure you discuss any questions you have with your health care provider. Document Released: 08/22/2003 Document Revised: 11/12/2015 Document Reviewed: 10/23/2015 Elsevier Interactive Patient Education  2018 [employer].

## 2017-10-12 NOTE — Progress Notes (Signed)
[place] 34+3 wks,cephalic,anterior pl gr 2,afi 12 cm,fhr 138 bpm,EFW 2494 g 53%

## 2017-10-26 ENCOUNTER — Ambulatory Visit (INDEPENDENT_AMBULATORY_CARE_PROVIDER_SITE_OTHER): Payer: Medicaid Other | Admitting: Women's Health

## 2017-10-26 ENCOUNTER — Encounter: Payer: Self-pay | Admitting: Women's Health

## 2017-10-26 VITALS — BP 120/80 | Wt 119.4 lb

## 2017-10-26 DIAGNOSIS — Z3A36 36 weeks gestation of pregnancy: Secondary | ICD-10-CM

## 2017-10-26 DIAGNOSIS — Z331 Pregnant state, incidental: Secondary | ICD-10-CM

## 2017-10-26 DIAGNOSIS — Z3403 Encounter for supervision of normal first pregnancy, third trimester: Secondary | ICD-10-CM

## 2017-10-26 DIAGNOSIS — Z1389 Encounter for screening for other disorder: Secondary | ICD-10-CM

## 2017-10-26 DIAGNOSIS — O26843 Uterine size-date discrepancy, third trimester: Secondary | ICD-10-CM

## 2017-10-26 LAB — POCT URINALYSIS DIPSTICK
Blood, UA: NEGATIVE
Glucose, UA: NEGATIVE
Leukocytes, UA: NEGATIVE
NITRITE UA: NEGATIVE

## 2017-10-26 NOTE — Progress Notes (Signed)
   LOW-RISK PREGNANCY VISIT Patient name: Sheri Hill MRN [id number]  Date of birth: 08-01-1990 Chief Complaint:   low risk pregnancy  History of Present Illness:   Sheri Hill is a 27 y.o. G53P1001 female at [redacted]w[redacted]d with an Estimated Date of Delivery: 11/20/17 being seen today for ongoing management of a low-risk pregnancy.  Today she reports no complaints. Contractions: Regular.  .  Movement: Present. denies leaking of fluid. Review of Systems:   Pertinent items are noted in HPI Denies abnormal vaginal discharge w/ itching/odor/irritation, headaches, visual changes, shortness of breath, chest pain, abdominal pain, severe nausea/vomiting, or problems with urination or bowel movements unless otherwise stated above. Pertinent History Reviewed:  Reviewed past medical,surgical, social, obstetrical and family history.  Reviewed problem list, medications and allergies. Physical Assessment:   Vitals:   10/26/17 1344  BP: 120/80  Weight: 119 lb 6.4 oz (54.2 kg)  Body mass index is 20.49 kg/m.        Physical Examination:   General appearance: Well appearing, and in no distress  Mental status: Alert, oriented to person, place, and time  Skin: Warm & dry  Cardiovascular: Normal heart rate noted  Respiratory: Normal respiratory effort, no distress  Abdomen: Soft, gravid, nontender  Pelvic: Cervical exam deferred         Extremities: Edema: None  Fetal Status: Fetal Heart Rate (bpm): 150 Fundal Height: 30 cm Movement: Present Presentation: Vertex  Results for orders placed or performed in visit on 10/26/17 (from the past 24 hour(s))  POCT urinalysis dipstick   Collection Time: 10/26/17  1:51 PM  Result Value Ref Range   Color, UA     Clarity, UA     Glucose, UA neg    Bilirubin, UA     Ketones, UA small    Spec Grav, UA  1.010 - 1.025   Blood, UA neg    pH, UA  5.0 - 8.0   Protein, UA trace    Urobilinogen, UA  0.2 or 1.0 E.U./dL   Nitrite, UA neg    Leukocytes, UA Negative  Negative   Appearance     Odor      Assessment & Plan:  1) Low-risk pregnancy G2P1001 at [redacted]w[redacted]d with an Estimated Date of Delivery: 11/20/17   2) Persistent size <dates, last u/s 2wks ago at 34.3wks, EFW 53%, AFI 12cm, will continue to monitor   Meds: No orders of the defined types were placed in this encounter.  Labs/procedures today: none  Plan:  Continue routine obstetrical care   Reviewed: Preterm labor symptoms and general obstetric precautions including but not limited to vaginal bleeding, contractions, leaking of fluid and fetal movement were reviewed in detail with the patient.  All questions were answered  Follow-up: Return in about 1 week (around 11/02/2017) for LROB. and gbs  Orders Placed This Encounter  Procedures  . POCT urinalysis dipstick   Cheral Marker CNM, [facility] 10/26/2017 2:13 PM

## 2017-10-26 NOTE — Patient Instructions (Signed)
[name], I greatly value your feedback.  If you receive a survey following your visit with [place] today, we appreciate you taking the time to fill it out.  Thanks, [name], CNM, WHNP-BC   Call the office [phone]) or go to [facility] if:  You begin to have strong, frequent contractions  Your water breaks.  Sometimes it is a big gush of fluid, sometimes it is just a trickle that keeps getting your panties wet or running down your legs  You have vaginal bleeding.  It is normal to have a small amount of spotting if your cervix was checked.   You don't feel your baby moving like normal.  If you don't, get you something to eat and drink and lay down and focus on feeling your baby move.  You should feel at least 10 movements in 2 hours.  If you don't, you should call the office or go to [facility].     Preterm Labor and Birth Information The normal length of a pregnancy is 39-41 weeks. Preterm labor is when labor starts before 37 completed weeks of pregnancy. What are the risk factors for preterm labor? Preterm labor is more likely to occur in women who:  Have certain infections during pregnancy such as a bladder infection, sexually transmitted infection, or infection inside the uterus (chorioamnionitis).  Have a shorter-than-normal cervix.  Have gone into preterm labor before.  Have had surgery on their cervix.  Are younger than age 38 or older than age 31.  Are African American.  Are pregnant with twins or multiple babies (multiple gestation).  Take street drugs or smoke while pregnant.  Do not gain enough weight while pregnant.  Became pregnant shortly after having been pregnant.  What are the symptoms of preterm labor? Symptoms of preterm labor include:  Cramps similar to those that can happen during a menstrual period. The cramps may happen with diarrhea.  Pain in the abdomen or lower back.  Regular uterine contractions that may feel like tightening of  the abdomen.  A feeling of increased pressure in the pelvis.  Increased watery or bloody mucus discharge from the vagina.  Water breaking (ruptured amniotic sac).  Why is it important to recognize signs of preterm labor? It is important to recognize signs of preterm labor because babies who are born prematurely may not be fully developed. This can put them at an increased risk for:  Long-term (chronic) heart and lung problems.  Difficulty immediately after birth with regulating body systems, including blood sugar, body temperature, heart rate, and breathing rate.  Bleeding in the brain.  Cerebral palsy.  Learning difficulties.  Death.  These risks are highest for babies who are born before 34 weeks of pregnancy. How is preterm labor treated? Treatment depends on the length of your pregnancy, your condition, and the health of your baby. It may involve:  Having a stitch (suture) placed in your cervix to prevent your cervix from opening too early (cerclage).  Taking or being given medicines, such as: ? Hormone medicines. These may be given early in pregnancy to help support the pregnancy. ? Medicine to stop contractions. ? Medicines to help mature the baby's lungs. These may be prescribed if the risk of delivery is high. ? Medicines to prevent your baby from developing cerebral palsy.  If the labor happens before 34 weeks of pregnancy, you may need to stay in the hospital. What should I do if I think I am in preterm labor? If  you think that you are going into preterm labor, call your health care provider right away. How can I prevent preterm labor in future pregnancies? To increase your chance of having a full-term pregnancy:  Do not use any tobacco products, such as cigarettes, chewing tobacco, and e-cigarettes. If you need help quitting, ask your health care provider.  Do not use street drugs or medicines that have not been prescribed to you during your pregnancy.  Talk  with your health care provider before taking any herbal supplements, even if you have been taking them regularly.  Make sure you gain a healthy amount of weight during your pregnancy.  Watch for infection. If you think that you might have an infection, get it checked right away.  Make sure to tell your health care provider if you have gone into preterm labor before.  This information is not intended to replace advice given to you by your health care provider. Make sure you discuss any questions you have with your health care provider. Document Released: 08/22/2003 Document Revised: 11/12/2015 Document Reviewed: 10/23/2015 Elsevier Interactive Patient Education  2018 [employer].

## 2017-11-03 ENCOUNTER — Encounter: Payer: Medicaid Other | Admitting: Obstetrics and Gynecology

## 2017-11-05 ENCOUNTER — Ambulatory Visit (INDEPENDENT_AMBULATORY_CARE_PROVIDER_SITE_OTHER): Payer: Medicaid Other | Admitting: Women's Health

## 2017-11-05 ENCOUNTER — Encounter: Payer: Self-pay | Admitting: Women's Health

## 2017-11-05 VITALS — BP 114/72 | HR 80 | Wt 119.0 lb

## 2017-11-05 DIAGNOSIS — O26843 Uterine size-date discrepancy, third trimester: Secondary | ICD-10-CM

## 2017-11-05 DIAGNOSIS — Z3A37 37 weeks gestation of pregnancy: Secondary | ICD-10-CM

## 2017-11-05 DIAGNOSIS — Z3403 Encounter for supervision of normal first pregnancy, third trimester: Secondary | ICD-10-CM

## 2017-11-05 DIAGNOSIS — Z1389 Encounter for screening for other disorder: Secondary | ICD-10-CM

## 2017-11-05 DIAGNOSIS — Z331 Pregnant state, incidental: Secondary | ICD-10-CM

## 2017-11-05 LAB — POCT URINALYSIS DIPSTICK
Glucose, UA: NEGATIVE
Ketones, UA: NEGATIVE
Leukocytes, UA: NEGATIVE
NITRITE UA: NEGATIVE
PROTEIN UA: NEGATIVE
RBC UA: NEGATIVE

## 2017-11-05 NOTE — Patient Instructions (Signed)
Sheri Hill, I greatly value your feedback.  If you receive a survey following your visit with [place] today, we appreciate you taking the time to fill it out.  Thanks, Sheri Hill, CNM, WHNP-BC   Call the office [phone]) or go to [facility] if:  You begin to have strong, frequent contractions  Your water breaks.  Sometimes it is a big gush of fluid, sometimes it is just a trickle that keeps getting your panties wet or running down your legs  You have vaginal bleeding.  It is normal to have a small amount of spotting if your cervix was checked.   You don't feel your baby moving like normal.  If you don't, get you something to eat and drink and lay down and focus on feeling your baby move.  You should feel at least 10 movements in 2 hours.  If you don't, you should call the office or go to [facility].     Sheri Hill Contractions Contractions of the uterus can occur throughout pregnancy, but they are not always a sign that you are in labor. You may have practice contractions called Braxton Hicks contractions. These false labor contractions are sometimes confused with true labor. What are Sheri Hill contractions? Braxton Hicks contractions are tightening movements that occur in the muscles of the uterus before labor. Unlike true labor contractions, these contractions do not result in opening (dilation) and thinning of the cervix. Toward the end of pregnancy (32-34 weeks), Braxton Hicks contractions can happen more often and may become stronger. These contractions are sometimes difficult to tell apart from true labor because they can be very uncomfortable. You should not feel embarrassed if you go to the hospital with false labor. Sometimes, the only way to tell if you are in true labor is for your health care provider to look for changes in the cervix. The health care provider will do a physical exam and may monitor your contractions. If you are not in true labor, the exam should show  that your cervix is not dilating and your water has not broken. If there are other health problems associated with your pregnancy, it is completely safe for you to be sent home with false labor. You may continue to have Braxton Hicks contractions until you go into true labor. How to tell the difference between true labor and false labor True labor  Contractions last 30-70 seconds.  Contractions become very regular.  Discomfort is usually felt in the top of the uterus, and it spreads to the lower abdomen and low back.  Contractions do not go away with walking.  Contractions usually become more intense and increase in frequency.  The cervix dilates and gets thinner. False labor  Contractions are usually shorter and not as strong as true labor contractions.  Contractions are usually irregular.  Contractions are often felt in the front of the lower abdomen and in the groin.  Contractions may go away when you walk around or change positions while lying down.  Contractions get weaker and are shorter-lasting as time goes on.  The cervix usually does not dilate or become thin. Follow these instructions at home:  Take over-the-counter and prescription medicines only as told by your health care provider.  Keep up with your usual exercises and follow other instructions from your health care provider.  Eat and drink lightly if you think you are going into labor.  If Braxton Hicks contractions are making you uncomfortable: ? Change your position from lying  down or resting to walking, or change from walking to resting. ? Sit and rest in a tub of warm water. ? Drink enough fluid to keep your urine pale yellow. Dehydration may cause these contractions. ? Do slow and deep breathing several times an hour.  Keep all follow-up prenatal visits as told by your health care provider. This is important. Contact a health care provider if:  You have a fever.  You have continuous pain in your  abdomen. Get help right away if:  Your contractions become stronger, more regular, and closer together.  You have fluid leaking or gushing from your vagina.  You pass blood-tinged mucus (bloody show).  You have bleeding from your vagina.  You have low back pain that you never had before.  You feel your baby's head pushing down and causing pelvic pressure.  Your baby is not moving inside you as much as it used to. Summary  Contractions that occur before labor are called Braxton Hicks contractions, false labor, or practice contractions.  Braxton Hicks contractions are usually shorter, weaker, farther apart, and less regular than true labor contractions. True labor contractions usually become progressively stronger and regular and they become more frequent.  Manage discomfort from [facility] contractions by changing position, resting in a warm bath, drinking plenty of water, or practicing deep breathing. This information is not intended to replace advice given to you by your health care provider. Make sure you discuss any questions you have with your health care provider. Document Released: 10/15/2016 Document Revised: 10/15/2016 Document Reviewed: 10/15/2016 Elsevier Interactive Patient Education  2018 [employer].

## 2017-11-05 NOTE — Progress Notes (Signed)
   LOW-RISK PREGNANCY VISIT Patient name: Sheri Hill MRN [id number]  Date of birth: 01-Oct-1990 Chief Complaint:   Routine Prenatal Visit  History of Present Illness:   Sheri Hill is a 26 y.o. G65P1001 female at [redacted]w[redacted]d with an Estimated Date of Delivery: 11/20/17 being seen today for ongoing management of a low-risk pregnancy.  Today she reports no complaints. Contractions: Not present. Vag. Bleeding: None.  Movement: Present. denies leaking of fluid. Review of Systems:   Pertinent items are noted in HPI Denies abnormal vaginal discharge w/ itching/odor/irritation, headaches, visual changes, shortness of breath, chest pain, abdominal pain, severe nausea/vomiting, or problems with urination or bowel movements unless otherwise stated above. Pertinent History Reviewed:  Reviewed past medical,surgical, social, obstetrical and family history.  Reviewed problem list, medications and allergies. Physical Assessment:   Vitals:   11/05/17 1043  BP: 114/72  Pulse: 80  Weight: 119 lb (54 kg)  Body mass index is 20.43 kg/m.        Physical Examination:   General appearance: Well appearing, and in no distress  Mental status: Alert, oriented to person, place, and time  Skin: Warm & dry  Cardiovascular: Normal heart rate noted  Respiratory: Normal respiratory effort, no distress  Abdomen: Soft, gravid, nontender  Pelvic: Cervical exam deferred, declines         Extremities: Edema: None  Fetal Status: Fetal Heart Rate (bpm): 148 Fundal Height: 33 cm Movement: Present Presentation: Vertex by Leopold's  Results for orders placed or performed in visit on 11/05/17 (from the past 24 hour(s))  POCT Urinalysis Dipstick   Collection Time: 11/05/17 10:43 AM  Result Value Ref Range   Color, UA     Clarity, UA     Glucose, UA Negative Negative   Bilirubin, UA     Ketones, UA neg    Spec Grav, UA  1.010 - 1.025   Blood, UA neg    pH, UA  5.0 - 8.0   Protein, UA Negative Negative   Urobilinogen,  UA  0.2 or 1.0 E.U./dL   Nitrite, UA neg    Leukocytes, UA Negative Negative   Appearance     Odor      Assessment & Plan:  1) Low-risk pregnancy G2P1001 at [redacted]w[redacted]d with an Estimated Date of Delivery: 11/20/17   2) Uterine s<d, EFW 53%/AFI 12 at 34.3wks   Meds: No orders of the defined types were placed in this encounter.  Labs/procedures today: gbs, gc/ct. Declines tdap  Plan:  Continue routine obstetrical care   Reviewed: Term labor symptoms and general obstetric precautions including but not limited to vaginal bleeding, contractions, leaking of fluid and fetal movement were reviewed in detail with the patient.  All questions were answered  Follow-up: Return in about 1 week (around 11/12/2017) for LROB.  Orders Placed This Encounter  Procedures  . GC/Chlamydia Probe Amp([facility])  . Culture, beta strep (group b only)  . POCT Urinalysis Dipstick   Cheral Marker CNM, [facility] 11/05/2017 11:04 AM

## 2017-11-09 ENCOUNTER — Inpatient Hospital Stay (HOSPITAL_COMMUNITY)
Admission: AD | Admit: 2017-11-09 | Discharge: 2017-11-10 | DRG: 807 | Disposition: A | Discharge status: Home / Self Care | Payer: Medicaid Other | Source: Ambulatory Visit | Attending: Family Medicine | Admitting: Family Medicine

## 2017-11-09 ENCOUNTER — Other Ambulatory Visit: Payer: Self-pay

## 2017-11-09 ENCOUNTER — Encounter (HOSPITAL_COMMUNITY): Payer: Self-pay

## 2017-11-09 DIAGNOSIS — Z3A38 38 weeks gestation of pregnancy: Secondary | ICD-10-CM

## 2017-11-09 DIAGNOSIS — Z3483 Encounter for supervision of other normal pregnancy, third trimester: Secondary | ICD-10-CM | POA: Diagnosis present

## 2017-11-09 DIAGNOSIS — O479 False labor, unspecified: Secondary | ICD-10-CM | POA: Diagnosis present

## 2017-11-09 DIAGNOSIS — O0932 Supervision of pregnancy with insufficient antenatal care, second trimester: Secondary | ICD-10-CM

## 2017-11-09 LAB — TYPE AND SCREEN
ABO/RH(D): O POS
Antibody Screen: NEGATIVE

## 2017-11-09 LAB — CBC
HEMATOCRIT: 37.7 % (ref 36.0–46.0)
HEMOGLOBIN: 12.8 g/dL (ref 12.0–15.0)
MCH: 31.5 pg (ref 26.0–34.0)
MCHC: 34 g/dL (ref 30.0–36.0)
MCV: 92.9 fL (ref 78.0–100.0)
Platelets: 156 10*3/uL (ref 150–400)
RBC: 4.06 MIL/uL (ref 3.87–5.11)
RDW: 13.1 % (ref 11.5–15.5)
WBC: 15.2 10*3/uL — ABNORMAL HIGH (ref 4.0–10.5)

## 2017-11-09 LAB — CULTURE, BETA STREP (GROUP B ONLY): Strep Gp B Culture: NEGATIVE

## 2017-11-09 LAB — ABO/RH: ABO/RH(D): O POS

## 2017-11-09 MED ORDER — OXYCODONE-ACETAMINOPHEN 5-325 MG PO TABS: 2.0000 | ORAL_TABLET | ORAL | Status: DC | PRN

## 2017-11-09 MED ORDER — TETANUS-DIPHTH-ACELL PERTUSSIS 5-2.5-18.5 LF-MCG/0.5 IM SUSP: 0.5000 mL | Freq: Once | INTRAMUSCULAR | Status: DC

## 2017-11-09 MED ORDER — LIDOCAINE HCL (PF) 1 % IJ SOLN
30.0000 mL | INTRAMUSCULAR | Status: DC | PRN
  Administered 2017-11-09: 30 mL via SUBCUTANEOUS
  Filled 2017-11-09: qty 30

## 2017-11-09 MED ORDER — ONDANSETRON HCL 4 MG/2ML IJ SOLN: 4.0000 mg | INTRAMUSCULAR | Status: DC | PRN

## 2017-11-09 MED ORDER — ONDANSETRON HCL 4 MG PO TABS: 4.0000 mg | ORAL_TABLET | ORAL | Status: DC | PRN

## 2017-11-09 MED ORDER — ZOLPIDEM TARTRATE 5 MG PO TABS: 5.0000 mg | ORAL_TABLET | Freq: Every evening | ORAL | Status: DC | PRN

## 2017-11-09 MED ORDER — DIPHENHYDRAMINE HCL 25 MG PO CAPS: 25.0000 mg | ORAL_CAPSULE | Freq: Four times a day (QID) | ORAL | Status: DC | PRN

## 2017-11-09 MED ORDER — OXYCODONE-ACETAMINOPHEN 5-325 MG PO TABS: 1.0000 | ORAL_TABLET | ORAL | Status: DC | PRN

## 2017-11-09 MED ORDER — COCONUT OIL OIL: 1.0000 "application " | TOPICAL_OIL | Status: DC | PRN

## 2017-11-09 MED ORDER — OXYTOCIN 40 UNITS IN LACTATED RINGERS INFUSION - SIMPLE MED
2.5000 [IU]/h | INTRAVENOUS | Status: DC
  Filled 2017-11-09: qty 1000

## 2017-11-09 MED ORDER — LACTATED RINGERS IV SOLN
INTRAVENOUS | Status: DC
  Administered 2017-11-09: 11:00:00 via INTRAVENOUS

## 2017-11-09 MED ORDER — DIBUCAINE 1 % RE OINT
1.0000 | TOPICAL_OINTMENT | RECTAL | Status: DC | PRN
Start: 2017-11-09 — End: 2017-11-10

## 2017-11-09 MED ORDER — SENNOSIDES-DOCUSATE SODIUM 8.6-50 MG PO TABS
2.0000 | ORAL_TABLET | ORAL | Status: DC
  Administered 2017-11-09: 2 via ORAL
  Filled 2017-11-09: qty 2

## 2017-11-09 MED ORDER — ONDANSETRON HCL 4 MG/2ML IJ SOLN: 4.0000 mg | Freq: Four times a day (QID) | INTRAMUSCULAR | Status: DC | PRN

## 2017-11-09 MED ORDER — OXYTOCIN BOLUS FROM INFUSION
500.0000 mL | Freq: Once | INTRAVENOUS | Status: AC
  Administered 2017-11-09: 500 mL via INTRAVENOUS

## 2017-11-09 MED ORDER — SOD CITRATE-CITRIC ACID 500-334 MG/5ML PO SOLN: 30.0000 mL | ORAL | Status: DC | PRN

## 2017-11-09 MED ORDER — ACETAMINOPHEN 325 MG PO TABS: 650.0000 mg | ORAL_TABLET | ORAL | Status: DC | PRN

## 2017-11-09 MED ORDER — WITCH HAZEL-GLYCERIN EX PADS: 1.0000 "application " | MEDICATED_PAD | CUTANEOUS | Status: DC | PRN

## 2017-11-09 MED ORDER — LACTATED RINGERS IV SOLN: 500.0000 mL | INTRAVENOUS | Status: DC | PRN

## 2017-11-09 MED ORDER — BENZOCAINE-MENTHOL 20-0.5 % EX AERO
1.0000 | INHALATION_SPRAY | CUTANEOUS | Status: DC | PRN
Start: 2017-11-09 — End: 2017-11-10
  Filled 2017-11-09: qty 56

## 2017-11-09 MED ORDER — IBUPROFEN 600 MG PO TABS
600.0000 mg | ORAL_TABLET | Freq: Four times a day (QID) | ORAL | Status: DC
  Administered 2017-11-09 – 2017-11-10 (×4): 600 mg via ORAL
  Filled 2017-11-09 (×4): qty 1

## 2017-11-09 MED ORDER — PRENATAL MULTIVITAMIN CH
1.0000 | ORAL_TABLET | Freq: Every day | ORAL | Status: DC
  Administered 2017-11-10: 1 via ORAL
  Filled 2017-11-09: qty 1

## 2017-11-09 MED ORDER — SIMETHICONE 80 MG PO CHEW
80.0000 mg | CHEWABLE_TABLET | ORAL | Status: DC | PRN
Start: 2017-11-09 — End: 2017-11-10

## 2017-11-09 NOTE — MAU Note (Signed)
Pt reports contractions since last pm, leaking fluid since 8 am.

## 2017-11-09 NOTE — Lactation Note (Signed)
This note was copied from a baby's chart. Lactation Consultation Note  Patient Name: Sheri Hill VHQIO'N Date: 11/09/2017 Reason for consult: Initial assessment  Visited with P2 Mom of early term 5 hr old infant.  Mom had baby swaddled in cradle hold.  Baby noted to be latched on shallow.  Baby taken off breast to reposition and support baby better. Mom has long, erect nipples.  Talked with Mom about importance of baby latching deeply onto areola, not only nipple.  Assisted Mom with sandwiching breast and bringing baby onto breast when she opens widely.  Baby latched much deeper.  Nutritive sucking noted.  Encouraged Mom to continue to support and sandwich breast during feeding.    Encouraged keeping baby STS and feeding whenever baby cues she is hungry.  Goal of 8-12 feedings per 24 hrs. Lactation brochure left with Mom.  Mom aware of IP and OP lactation services.  Encouraged to call prn  Maternal Data Formula Feeding for Exclusion: No Has patient been taught Hand Expression?: Yes Does the patient have breastfeeding experience prior to this delivery?: Yes  Feeding Feeding Type: Breast Fed Length of feed: 15 min  LATCH Score Latch: Grasps breast easily, tongue down, lips flanged, rhythmical sucking.  Audible Swallowing: A few with stimulation  Type of Nipple: Everted at rest and after stimulation  Comfort (Breast/Nipple): Soft / non-tender  Hold (Positioning): Assistance needed to correctly position infant at breast and maintain latch.  LATCH Score: 8  Interventions Interventions: Breast feeding basics reviewed;Assisted with latch;Skin to skin;Breast massage;Hand express;Breast compression;Adjust position;Support pillows;Position options;Expressed milk   Consult Status Consult Status: Follow-up Date: 11/10/17 Follow-up type: In-patient    Judee Clara 11/09/2017, 6:57 PM

## 2017-11-09 NOTE — H&P (Addendum)
LABOR ADMISSION HISTORY AND PHYSICAL  Sheri Hill is a 27 y.o. female G2P1001 with IUP at [redacted]w[redacted]d by [place] presenting for spontaneous onset of labor. She reports +FMs, No LOF, no VB, no blurry vision, headaches or peripheral edema, and RUQ pain.  She plans on Breast feeding. She request Nothing for birth control.  Prenatal History/Complications: PNC Office: [facility]  Past Medical History: Past Medical History:  Diagnosis Date  . Medical history non-contributory     Past Surgical History: Past Surgical History:  Procedure Laterality Date  . NO PAST SURGERIES      Obstetrical History: OB History    Gravida  2   Para  1   Term  1   Preterm      AB      Living  1     SAB      TAB      Ectopic      Multiple      Live Births  1           Social History: Social History   Socioeconomic History  . Marital status: Single    Spouse name: Not on file  . Number of children: Not on file  . Years of education: Not on file  . Highest education level: Not on file  Occupational History  . Not on file  Social Needs  . Financial resource strain: Not on file  . Food insecurity:    Worry: Not on file    Inability: Not on file  . Transportation needs:    Medical: Not on file    Non-medical: Not on file  Tobacco Use  . Smoking status: Never Smoker  . Smokeless tobacco: Never Used  Substance and Sexual Activity  . Alcohol use: No  . Drug use: No  . Sexual activity: Yes    Birth control/protection: None  Lifestyle  . Physical activity:    Days per week: Not on file    Minutes per session: Not on file  . Stress: Not on file  Relationships  . Social connections:    Talks on phone: Not on file    Gets together: Not on file    Attends religious service: Not on file    Active member of club or organization: Not on file    Attends meetings of clubs or organizations: Not on file    Relationship status: Not on file  Other Topics Concern  . Not on file  Social  History Narrative  . Not on file    Family History: Family History  Problem Relation Age of Onset  . Cancer Neg Hx   . Diabetes Neg Hx   . Heart failure Neg Hx     Allergies: No Known Allergies  Medications Prior to Admission  Medication Sig Dispense Refill Last Dose  . butalbital-acetaminophen-caffeine (FIORICET, ESGIC) 50-325-40 MG tablet Take 1 tablet by mouth every 6 (six) hours as needed for headache. 20 tablet 0 Past Month at Unknown time  . Prenatal Vit-Fe Fumarate-FA (PRENATAL MULTIVITAMIN) TABS tablet Take 1 tablet by mouth daily at 12 noon.   11/08/2017 at Unknown time    Review of Systems   All systems reviewed and negative except as stated in HPI  Blood pressure 118/79, pulse (!) 101, temperature 99 F (37.2 C), temperature source Oral, resp. rate 16, height  (1.626 m), weight 123 lb (55.8 kg), last menstrual period 02/13/2017, unknown if currently breastfeeding. General appearance: alert and no distress HEENT:  MMM, no jvd Abdomen: soft, non-tender; bowel sounds normal Extremities: Homans sign is negative, no sign of DVT Presentation: cephalic Fetal monitoringBaseline: 140 bpm, Variability: Good {> 6 bpm), Accelerations: Reactive and Decelerations: Absent Uterine activityFrequency: Every 2 minutes Dilation: 7.5 Effacement (%): 80 Station: 0, Plus 1 Exam by:: Lorn Junes, RN   Prenatal labs: ABO, Rh: O/Positive/-- (02/11 1455) Antibody: Negative (03/11 0934) Rubella: 1.58 (02/11 1455) RPR: Non Reactive (03/11 0934)  HBsAg: Negative (02/11 1455)  HIV: Non Reactive (03/11 0934)  GBS:    2 hr Glucola normal  Prenatal Transfer Tool  Maternal Diabetes: No Genetic Screening: Normal Maternal Ultrasounds/Referrals: Normal Fetal Ultrasounds or other Referrals:  None Maternal Substance Abuse:  No Significant Maternal Medications:  None Significant Maternal Lab Results: None  Results for orders placed or performed during the hospital encounter of 11/09/17  (from the past 24 hour(s))  CBC   Collection Time: 11/09/17 11:13 AM  Result Value Ref Range   WBC 15.2 (H) 4.0 - 10.5 K/uL   RBC 4.06 3.87 - 5.11 MIL/uL   Hemoglobin 12.8 12.0 - 15.0 g/dL   HCT 78.2 95.6 - 21.3 %   MCV 92.9 78.0 - 100.0 fL   MCH 31.5 26.0 - 34.0 pg   MCHC 34.0 30.0 - 36.0 g/dL   RDW 08.6 57.8 - 46.9 %   Platelets 156 150 - 400 K/uL    Patient Active Problem List   Diagnosis Date Noted  . Uterine contractions during pregnancy 11/09/2017  . Umbilical hernia 08/23/2017  . Late prenatal care in second trimester 07/26/2017  . Supervision of normal pregnancy 07/26/2017    Assessment: Sheri Hill is a 27 y.o. G2P1001 at [redacted]w[redacted]d here for Spontaneous onset of labor.  #Labor: Progressing well, AROM #Pain: Supportive #FWB: Cat 1 #ID:  GBS neg #MOF: Breast #MOC:Nothing wanted #Circ:  Female  Thomes Dinning, MD, MS FAMILY MEDICINE RESIDENT - PGY1 11/09/2017 11:52 AM    I confirm that I have verified the information documented in the resident's note and that I have also personally reperformed the physical exam and all medical decision making activities.   Thressa Sheller 1:17 PM 11/09/17

## 2017-11-09 NOTE — MAU Note (Signed)
Urine sent to lab 

## 2017-11-09 NOTE — Progress Notes (Addendum)
G2P1 @ 38.[redacted] wksga. Here dt contractions that started this morining at 03 with bloody show. DENIES LOF although charge nurse stated pt has been leaking since 06 but when questioned, pt and SO is denying LOF; just ctx and bloody show only.   +FM  EFM applied.   1035: VE: 6.5/80/BB  1039 Birthing notified. Awaiting to give report and bed assignment  1040: Provider notified. Report given. Orders received to admit.  1058: Birthing charge notified. Report given. Room assigned to 166.

## 2017-11-10 LAB — RPR: RPR Ser Ql: NONREACTIVE

## 2017-11-10 LAB — GC/CHLAMYDIA PROBE AMP

## 2017-11-10 MED ORDER — IBUPROFEN 600 MG PO TABS: 600.0000 mg | ORAL_TABLET | Freq: Four times a day (QID) | ORAL | 0 refills | Status: DC

## 2017-11-10 MED ORDER — ACETAMINOPHEN 325 MG PO TABS: 650.0000 mg | ORAL_TABLET | ORAL | 0 refills | Status: AC | PRN

## 2017-11-10 MED ORDER — SENNOSIDES-DOCUSATE SODIUM 8.6-50 MG PO TABS: 2.0000 | ORAL_TABLET | ORAL | 0 refills | Status: AC

## 2017-11-10 MED ORDER — TETANUS-DIPHTH-ACELL PERTUSSIS 5-2.5-18.5 LF-MCG/0.5 IM SUSP: 0.5000 mL | Freq: Once | INTRAMUSCULAR | 0 refills | Status: AC

## 2017-11-10 NOTE — Plan of Care (Signed)
Pt. Condition will continue to improve 

## 2017-11-10 NOTE — Discharge Summary (Addendum)
OB Discharge Summary     Patient Name: Sheri Hill DOB: 1990-12-08 MRN: [id number]  Date of admission: 11/09/2017 Delivering MD: Calvert Cantor   Date of discharge: 11/10/2017  Admitting diagnosis: LABOR Intrauterine pregnancy: [redacted]w[redacted]d     Secondary diagnosis:  Active Problems:   Uterine contractions during pregnancy  Additional problems: None     Discharge diagnosis: Term Pregnancy Delivered                                                                                                Post partum procedures:None  Augmentation: AROM  Complications: None  Hospital course:  Onset of Labor With Vaginal Delivery     27 y.o. yo [id number] at [redacted]w[redacted]d was admitted in Active Labor on 11/09/2017. Patient had an uncomplicated labor course as follows:  Membrane Rupture Time/Date: 11:42 AM ,11/09/2017   Intrapartum Procedures: Episiotomy: None [1]                                         Lacerations:  1st degree [2];Periurethral [8]  Patient had a delivery of a Viable infant. 11/09/2017  Information for the patient's newborn:  Kadince, Boxley [[id number]]  Delivery Method: Vaginal, Spontaneous(Filed from Delivery Summary)    Pateint had an uncomplicated postpartum course.  She is ambulating, tolerating a regular diet, passing flatus, and urinating well. Patient is discharged home in stable condition on 11/10/17.   Physical exam  Vitals:   11/09/17 1445 11/09/17 1845 11/10/17 0300 11/10/17 0734  BP: 117/74 119/72 117/78 117/62  Pulse: 74 72 63 70  Resp: Temp: 98 F (36.7 C) 98.2 F (36.8 C) 97.8 F (36.6 C) 98.2 F (36.8 C)  TempSrc: Oral Oral  Oral  SpO2:   99%   Weight:      Height:       General: alert, cooperative and no distress Lochia: appropriate Uterine Fundus: firm Incision: N/A DVT Evaluation: No evidence of DVT seen on physical exam. Labs: Lab Results  Component Value Date   WBC 15.2 (H) 11/09/2017   HGB 12.8 11/09/2017   HCT 37.7 11/09/2017    MCV 92.9 11/09/2017   PLT 156 11/09/2017   CMP Latest Ref Rng & Units 08/31/2017  Glucose 65 - 99 mg/dL 97  BUN 6 - 20 mg/dL 9  Creatinine 1.30 - 8.65 mg/dL 7.84  Sodium 696 - 295 mmol/L 136  Potassium 3.5 - 5.1 mmol/L 3.4(L)  Chloride 101 - 111 mmol/L 104  CO2 22 - 32 mmol/L 23  Calcium 8.9 - 10.3 mg/dL 9.0  Total Protein 6.5 - 8.1 g/dL 6.8  Total Bilirubin 0.3 - 1.2 mg/dL 0.4  Alkaline Phos 38 - 126 U/L 101  AST 15 - 41 U/L 18  ALT 14 - 54 U/L 14    Discharge instruction: per After Visit Summary and "Baby and Me Booklet".  After visit meds:  Allergies as of 11/10/2017   No Known Allergies     Medication List  STOP taking these medications   butalbital-acetaminophen-caffeine 50-325-40 MG tablet Commonly known as:  FIORICET, ESGIC     TAKE these medications   acetaminophen 325 MG tablet Commonly known as:  TYLENOL Take 2 tablets (650 mg total) by mouth every 4 (four) hours as needed for fever.   ibuprofen 600 MG tablet Commonly known as:  ADVIL,MOTRIN Take 1 tablet (600 mg total) by mouth every 6 (six) hours.   prenatal multivitamin Tabs tablet Take 1 tablet by mouth daily at 12 noon.   senna-docusate 8.6-50 MG tablet Commonly known as:  Senokot-S Take 2 tablets by mouth daily. Start taking on:  11/11/2017   Tdap 5-2.5-18.5 LF-MCG/0.5 injection Commonly known as:  BOOSTRIX Inject 0.5 mLs into the muscle once for 1 dose.       Diet: routine diet  Activity: Advance as tolerated. Pelvic rest for 6 weeks.   Outpatient follow up:6 weeks Follow up Appt:No future appointments. Follow up Visit:No follow-ups on file.  Postpartum contraception: None  Newborn Data: Live born female  Birth Weight: 6 lb 10 oz (3005 g) APGAR: 9, 9  Newborn Delivery   Birth date/time:  11/09/2017 12:06:00 Delivery type:  Vaginal, Spontaneous     Baby Feeding: Breast Disposition:home with mother   11/10/2017 Garnette Gunner, MD  I confirm that I have verified the  information documented in the resident's note and that I have also personally reperformed the physical exam and all medical decision making activities.  Sharyon Cable, CNM 11/10/17, 8:41 PM

## 2017-11-10 NOTE — Discharge Instructions (Signed)
Vaginal Delivery, Care After °Refer to this sheet in the next few weeks. These instructions provide you with information about caring for yourself after vaginal delivery. Your health care provider may also give you more specific instructions. Your treatment has been planned according to current medical practices, but problems sometimes occur. Call your health care provider if you have any problems or questions. °What can I expect after the procedure? °After vaginal delivery, it is common to have: °· Some bleeding from your vagina. °· Soreness in your abdomen, your vagina, and the area of skin between your vaginal opening and your anus (perineum). °· Pelvic cramps. °· Fatigue. ° °Follow these instructions at home: °Medicines °· Take over-the-counter and prescription medicines only as told by your health care provider. °· If you were prescribed an antibiotic medicine, take it as told by your health care provider. Do not stop taking the antibiotic until it is finished. °Driving ° °· Do not drive or operate heavy machinery while taking prescription pain medicine. °· Do not drive for 24 hours if you received a sedative. °Lifestyle °· Do not drink alcohol. This is especially important if you are breastfeeding or taking medicine to relieve pain. °· Do not use tobacco products, including cigarettes, chewing tobacco, or e-cigarettes. If you need help quitting, ask your health care provider. °Eating and drinking °· Drink at least 8 eight-ounce glasses of water every day unless you are told not to by your health care provider. If you choose to breastfeed your baby, you may need to drink more water than this. °· Eat high-fiber foods every day. These foods may help prevent or relieve constipation. High-fiber foods include: °? Whole grain cereals and breads. °? Brown rice. °? Beans. °? Fresh fruits and vegetables. °Activity °· Return to your normal activities as told by your health care provider. Ask your health care provider  what activities are safe for you. °· Rest as much as possible. Try to rest or take a nap when your baby is sleeping. °· Do not lift anything that is heavier than your baby or 10 lb (4.5 kg) until your health care provider says that it is safe. °· Talk with your health care provider about when you can engage in sexual activity. This may depend on your: °? Risk of infection. °? Rate of healing. °? Comfort and desire to engage in sexual activity. °Vaginal Care °· If you have an episiotomy or a vaginal tear, check the area every day for signs of infection. Check for: °? More redness, swelling, or pain. °? More fluid or blood. °? Warmth. °? Pus or a bad smell. °· Do not use tampons or douches until your health care provider says this is safe. °· Watch for any blood clots that may pass from your vagina. These may look like clumps of dark red, brown, or black discharge. °General instructions °· Keep your perineum clean and dry as told by your health care provider. °· Wear loose, comfortable clothing. °· Wipe from front to back when you use the toilet. °· Ask your health care provider if you can shower or take a bath. If you had an episiotomy or a perineal tear during labor and delivery, your health care provider may tell you not to take baths for a certain length of time. °· Wear a bra that supports your breasts and fits you well. °· If possible, have someone help you with household activities and help care for your baby for at least a few days after   you leave the hospital. °· Keep all follow-up visits for you and your baby as told by your health care provider. This is important. °Contact a health care provider if: °· You have: °? Vaginal discharge that has a bad smell. °? Difficulty urinating. °? Pain when urinating. °? A sudden increase or decrease in the frequency of your bowel movements. °? More redness, swelling, or pain around your episiotomy or vaginal tear. °? More fluid or blood coming from your episiotomy or  vaginal tear. °? Pus or a bad smell coming from your episiotomy or vaginal tear. °? A fever. °? A rash. °? Little or no interest in activities you used to enjoy. °? Questions about caring for yourself or your baby. °· Your episiotomy or vaginal tear feels warm to the touch. °· Your episiotomy or vaginal tear is separating or does not appear to be healing. °· Your breasts are painful, hard, or turn red. °· You feel unusually sad or worried. °· You feel nauseous or you vomit. °· You pass large blood clots from your vagina. If you pass a blood clot from your vagina, save it to show to your health care provider. Do not flush blood clots down the toilet without having your health care provider look at them. °· You urinate more than usual. °· You are dizzy or light-headed. °· You have not breastfed at all and you have not had a menstrual period for 12 weeks after delivery. °· You have stopped breastfeeding and you have not had a menstrual period for 12 weeks after you stopped breastfeeding. °Get help right away if: °· You have: °? Pain that does not go away or does not get better with medicine. °? Chest pain. °? Difficulty breathing. °? Blurred vision or spots in your vision. °? Thoughts about hurting yourself or your baby. °· You develop pain in your abdomen or in one of your legs. °· You develop a severe headache. °· You faint. °· You bleed from your vagina so much that you fill two sanitary pads in one hour. °This information is not intended to replace advice given to you by your health care provider. Make sure you discuss any questions you have with your health care provider. °Document Released: 05/29/2000 Document Revised: 11/13/2015 Document Reviewed: 06/16/2015 °Elsevier Interactive Patient Education © 2018 [employer] ° °

## 2017-11-12 ENCOUNTER — Encounter: Payer: Medicaid Other | Admitting: Obstetrics and Gynecology

## 2017-12-17 ENCOUNTER — Ambulatory Visit: Payer: Medicaid Other | Admitting: Women's Health

## 2017-12-23 ENCOUNTER — Ambulatory Visit (INDEPENDENT_AMBULATORY_CARE_PROVIDER_SITE_OTHER): Payer: Medicaid Other | Admitting: Women's Health

## 2017-12-23 ENCOUNTER — Encounter: Payer: Self-pay | Admitting: Women's Health

## 2017-12-23 DIAGNOSIS — B9689 Other specified bacterial agents as the cause of diseases classified elsewhere: Secondary | ICD-10-CM | POA: Diagnosis not present

## 2017-12-23 DIAGNOSIS — N760 Acute vaginitis: Secondary | ICD-10-CM | POA: Diagnosis not present

## 2017-12-23 DIAGNOSIS — N898 Other specified noninflammatory disorders of vagina: Secondary | ICD-10-CM

## 2017-12-23 LAB — POCT WET PREP (WET MOUNT)
Clue Cells Wet Prep Whiff POC: POSITIVE
TRICHOMONAS WET PREP HPF POC: ABSENT

## 2017-12-23 MED ORDER — METRONIDAZOLE 500 MG PO TABS: 500.0000 mg | ORAL_TABLET | Freq: Two times a day (BID) | ORAL | 0 refills | Status: DC

## 2017-12-23 NOTE — Progress Notes (Signed)
POSTPARTUM VISIT Patient name: Sheri Hill MRN [id number]  Date of birth: 03-18-1991 Chief Complaint:   Postpartum Care  History of Present Illness:   Sheri Hill is a 27 y.o. 582P2002 Caucasian female being seen today for a postpartum visit. She is 6 weeks postpartum following a spontaneous vaginal delivery at 38.3 gestational weeks. Anesthesia: none. I have fully reviewed the prenatal and intrapartum course. Pregnancy complicated by late care @ 23wks. Postpartum course has been uncomplicated. Bleeding no bleeding. Bowel function is normal. Bladder function is normal. Reports vaginal odor. No itching/irriation.  Patient is sexually active. Last sexual activity: last week.  Contraception method is none, wants to get pregnant. Still taking pnv [place] Postpartum Depression Screening: negative. Score 0.   Last pap 07/12/15.  Results were normal .  No LMP recorded.  Baby's course has been uncomplicated. Baby is feeding by breast.  Review of Systems:   Pertinent items are noted in HPI Denies Abnormal vaginal discharge w/ itching/odor/irritation, headaches, visual changes, shortness of breath, chest pain, abdominal pain, severe nausea/vomiting, or problems with urination or bowel movements. Pertinent History Reviewed:  Reviewed past medical,surgical, obstetrical and family history.  Reviewed problem list, medications and allergies. OB History  Gravida Para Term Preterm AB Living  2 2 2     2   SAB TAB Ectopic Multiple Live Births        0 2    # Outcome Date GA Lbr Len/2nd Weight Sex Delivery Anes PTL Lv  2 Term 11/09/17 1069w3d 09:02 / 00:04 6 lb 10 oz (3.005 kg) F Vag-Spont Local  LIV  1 Term 12/09/15 4332w5d  6 lb 8 oz (2.948 kg) F Vag-Spont None N LIV   Physical Assessment:   Vitals:   12/23/17 1614  BP: 110/80  Pulse: 72  Weight: 98 lb 6.4 oz (44.6 kg)  Height: 5\' 4"  (1.626 m)  Body mass index is 16.89 kg/m.       Physical Examination:   General appearance: alert, well  appearing, and in no distress  Mental status: alert, oriented to person, place, and time  Skin: warm & dry   Cardiovascular: normal heart rate noted   Respiratory: normal respiratory effort, no distress   Breasts: deferred, no complaints   Abdomen: soft, non-tender   Pelvic: VULVA: normal appearing vulva with no masses, tenderness or lesions, UTERUS: uterus is normal size, shape, consistency and nontender; malodorous d/c  Rectal: no hemorrhoids  Extremities: no edema       Results for orders placed or performed in visit on 12/23/17 (from the past 24 hour(s))  POCT Wet Prep Mellody Drown([place])   Collection Time: 12/23/17  5:02 PM  Result Value Ref Range   Source Wet Prep POC vaginal    WBC, Wet Prep HPF POC none    Bacteria Wet Prep HPF POC Few Few   BACTERIA WET PREP MORPHOLOGY POC     Clue Cells Wet Prep HPF POC Few (A) None   Clue Cells Wet Prep Whiff POC Positive Whiff    Yeast Wet Prep HPF POC None None   KOH Wet Prep POC  None   Trichomonas Wet Prep HPF POC Absent Absent    Assessment & Plan:  1) Postpartum exam 2) 6 wks s/p SVB 3) Breastfeeding 4) Depression screening 5) Contraception counseling, pt prefers none, desires pregnancy, recommended waiting 18mth between births, continue pnv 6) Mild BV> Rx metronidazole 500mg  BID x 7d for BV, no sex or etoh while taking  Meds:  Meds ordered this encounter  Medications  . metroNIDAZOLE (FLAGYL) 500 MG tablet    Sig: Take 1 tablet (500 mg total) by mouth 2 (two) times daily.    Dispense:  14 tablet    Refill:  0    Order Specific Question:   Supervising Provider    Answer:   Lazaro Arms [[id number]]    Follow-up: Return for Jan for , Pap & physical.   Orders Placed This Encounter  Procedures  . POCT Wet Prep [facility] [place])    Cheral Marker CNM, [facility] 12/23/2017 5:04 PM

## 2018-06-15 NOTE — L&D Delivery Note (Signed)
Delivery Note At 10:22 PM a viable female was delivered via Vaginal, Spontaneous (Presentation: ROA with compound hand).  APGAR: 9, 9; weight pending.   Placenta status: spontaneous, intact.  Cord: 3 vessels   Anesthesia: none   Episiotomy: None Lacerations: None Suture Repair: n/a Est. Blood Loss (mL):  100  Mom to postpartum.  Baby to Couplet care / Skin to Skin.  Wende Mott CNM 04/14/2019, 10:35 PM

## 2019-01-11 ENCOUNTER — Encounter: Payer: Self-pay | Admitting: Adult Health

## 2019-01-11 ENCOUNTER — Other Ambulatory Visit: Payer: Self-pay

## 2019-01-11 ENCOUNTER — Ambulatory Visit (INDEPENDENT_AMBULATORY_CARE_PROVIDER_SITE_OTHER): Payer: Medicaid Other | Admitting: Adult Health

## 2019-01-11 VITALS — Ht 64.0 in | Wt 110.0 lb

## 2019-01-11 DIAGNOSIS — Z3A24 24 weeks gestation of pregnancy: Secondary | ICD-10-CM | POA: Diagnosis not present

## 2019-01-11 DIAGNOSIS — Z363 Encounter for antenatal screening for malformations: Secondary | ICD-10-CM | POA: Diagnosis not present

## 2019-01-11 NOTE — Progress Notes (Signed)
Patient ID: Sheri Hill, female   DOB: Apr 07, 1991, 28 y.o.   MRN: [id number]   TELEHEALTH VIRTUAL GYNECOLOGY VISIT ENCOUNTER NOTE  I connected with Sheri Hill on 01/11/19 at  4:00 PM EDT by telephone at home and verified that I am speaking with the correct person using two identifiers.   I discussed the limitations, risks, security and privacy concerns of performing an evaluation and management service by telephone and the availability of in person appointments. I also discussed with the patient that there may be a patient responsible charge related to this service. The patient expressed understanding and agreed to proceed.   History:  AYSHA LIVECCHI is a 28 y.o. G3P2002 ,white female,married, being evaluated today for no period since 07/21/18, so about 24+5 weeks with EDD 04/28/19, she had first +HPT about 6 months ago and had 10 +HPTs total, and she has +FM . She denies any abnormal vaginal discharge, bleeding, pelvic pain or other concerns.       Past Medical History:  Diagnosis Date  . Medical history non-contributory    Past Surgical History:  Procedure Laterality Date  . NO PAST SURGERIES     The following portions of the patient's history were reviewed and updated as appropriate: allergies, current medications, past family history, past medical history, past social history, past surgical history and problem list.   Health Maintenance: pap at new OB  Review of Systems:  Pertinent items noted in HPI and remainder of comprehensive ROS otherwise negative.  Physical Exam:   General:  Alert, oriented and cooperative.   Mental Status: Normal mood and affect perceived. Normal judgment and thought content.  Physical exam deferred due to nature of the encounter Ht 5\' 4"  (1.626 m)   Wt 110 lb (49.9 kg)   LMP 07/21/2018   Breastfeeding No   BMI 18.88 kg/m  per pt. Fall risk is low.   Labs and Imaging No results found for this or any previous visit (from the past 336 hour(s)). No  results found.    Assessment and Plan:     1. [redacted] weeks gestation of pregnancy Continue PNV  2. Antenatal screening for malformation using ultrasonics Anatomy US in 1 week, or asap - US OB DETAIL + 14 WK; Future       I discussed the assessment and treatment plan with the patient. The patient was provided an opportunity to ask questions and all were answered. The patient agreed with the plan and demonstrated an understanding of the instructions.   The patient was advised to call back or seek an in-person evaluation/go to the ED if the symptoms worsen or if the condition fails to improve as anticipated.  I provided 7 minutes of non-face-to-face time during this encounter.   Derrek Monaco, NP [facility] [employer], [place]

## 2019-01-20 ENCOUNTER — Ambulatory Visit (INDEPENDENT_AMBULATORY_CARE_PROVIDER_SITE_OTHER): Payer: Medicaid Other

## 2019-01-20 ENCOUNTER — Other Ambulatory Visit: Payer: Self-pay

## 2019-01-20 DIAGNOSIS — Z3A26 26 weeks gestation of pregnancy: Secondary | ICD-10-CM

## 2019-01-20 DIAGNOSIS — Z363 Encounter for antenatal screening for malformations: Secondary | ICD-10-CM | POA: Diagnosis not present

## 2019-01-20 NOTE — Progress Notes (Addendum)
[place] 82+8 wks,cephalic,cx 2.9 cm,posterior placenta gr 0,marginal cord insertion 1.9 cm to the tip of placenta,svp of fluid 4.6 cm,normal ovaries bilat,fhr 146 bpm,efw 944 g 54%,EDD 04/27/2019 BY LMP

## 2019-01-24 ENCOUNTER — Ambulatory Visit: Payer: Medicaid Other | Admitting: *Deleted

## 2019-01-25 ENCOUNTER — Encounter: Payer: Medicaid Other | Admitting: Advanced Practice Midwife

## 2019-01-25 ENCOUNTER — Ambulatory Visit: Payer: Medicaid Other | Admitting: *Deleted

## 2019-04-14 ENCOUNTER — Inpatient Hospital Stay (HOSPITAL_COMMUNITY)
Admission: AD | Admit: 2019-04-14 | Discharge: 2019-04-16 | DRG: 807 | Disposition: A | Discharge status: Home / Self Care | Payer: Medicaid Other | Attending: Obstetrics and Gynecology | Admitting: Obstetrics and Gynecology

## 2019-04-14 ENCOUNTER — Other Ambulatory Visit: Payer: Self-pay

## 2019-04-14 ENCOUNTER — Encounter (HOSPITAL_COMMUNITY): Payer: Self-pay | Admitting: *Deleted

## 2019-04-14 ENCOUNTER — Inpatient Hospital Stay (HOSPITAL_COMMUNITY): Payer: Medicaid Other

## 2019-04-14 DIAGNOSIS — O0930 Supervision of pregnancy with insufficient antenatal care, unspecified trimester: Secondary | ICD-10-CM

## 2019-04-14 DIAGNOSIS — Z3A38 38 weeks gestation of pregnancy: Secondary | ICD-10-CM

## 2019-04-14 DIAGNOSIS — O4403 Placenta previa specified as without hemorrhage, third trimester: Secondary | ICD-10-CM

## 2019-04-14 DIAGNOSIS — O4103X0 Oligohydramnios, third trimester, not applicable or unspecified: Secondary | ICD-10-CM | POA: Diagnosis not present

## 2019-04-14 DIAGNOSIS — Z20828 Contact with and (suspected) exposure to other viral communicable diseases: Secondary | ICD-10-CM | POA: Diagnosis present

## 2019-04-14 DIAGNOSIS — O4100X0 Oligohydramnios, unspecified trimester, not applicable or unspecified: Secondary | ICD-10-CM

## 2019-04-14 DIAGNOSIS — Z8759 Personal history of other complications of pregnancy, childbirth and the puerperium: Secondary | ICD-10-CM

## 2019-04-14 LAB — DIFFERENTIAL
Abs Immature Granulocytes: 0.04 10*3/uL (ref 0.00–0.07)
Basophils Absolute: 0 10*3/uL (ref 0.0–0.1)
Basophils Relative: 0 %
Eosinophils Absolute: 0.1 10*3/uL (ref 0.0–0.5)
Eosinophils Relative: 1 %
Immature Granulocytes: 0 %
Lymphocytes Relative: 13 %
Lymphs Abs: 1.6 10*3/uL (ref 0.7–4.0)
Monocytes Absolute: 0.8 10*3/uL (ref 0.1–1.0)
Monocytes Relative: 6 %
Neutro Abs: 9.5 10*3/uL — ABNORMAL HIGH (ref 1.7–7.7)
Neutrophils Relative %: 80 %

## 2019-04-14 LAB — CBC
HCT: 38.3 % (ref 36.0–46.0)
Hemoglobin: 12.8 g/dL (ref 12.0–15.0)
MCH: 30.7 pg (ref 26.0–34.0)
MCHC: 33.4 g/dL (ref 30.0–36.0)
MCV: 91.8 fL (ref 80.0–100.0)
Platelets: 220 10*3/uL (ref 150–400)
RBC: 4.17 MIL/uL (ref 3.87–5.11)
RDW: 13.2 % (ref 11.5–15.5)
WBC: 12 10*3/uL — ABNORMAL HIGH (ref 4.0–10.5)
nRBC: 0 % (ref 0.0–0.2)

## 2019-04-14 LAB — RAPID HIV SCREEN (HIV 1/2 AB+AG)
HIV 1/2 Antibodies: NONREACTIVE
HIV-1 P24 Antigen - HIV24: NONREACTIVE

## 2019-04-14 LAB — TYPE AND SCREEN
ABO/RH(D): O POS
Antibody Screen: NEGATIVE

## 2019-04-14 LAB — ABO/RH: ABO/RH(D): O POS

## 2019-04-14 LAB — HEPATITIS B SURFACE ANTIGEN: Hepatitis B Surface Ag: NONREACTIVE

## 2019-04-14 LAB — SARS CORONAVIRUS 2 BY RT PCR (HOSPITAL ORDER, PERFORMED IN ~~LOC~~ HOSPITAL LAB): SARS Coronavirus 2: NEGATIVE

## 2019-04-14 LAB — GROUP B STREP BY PCR: Group B strep by PCR: POSITIVE — AB

## 2019-04-14 MED ORDER — ONDANSETRON HCL 4 MG/2ML IJ SOLN: 4.0000 mg | Freq: Four times a day (QID) | INTRAMUSCULAR | Status: DC | PRN

## 2019-04-14 MED ORDER — OXYCODONE-ACETAMINOPHEN 5-325 MG PO TABS: 1.0000 | ORAL_TABLET | ORAL | Status: DC | PRN

## 2019-04-14 MED ORDER — ACETAMINOPHEN 325 MG PO TABS: 650.0000 mg | ORAL_TABLET | ORAL | Status: DC | PRN

## 2019-04-14 MED ORDER — LIDOCAINE HCL (PF) 1 % IJ SOLN: 30.0000 mL | INTRAMUSCULAR | Status: DC | PRN

## 2019-04-14 MED ORDER — FLEET ENEMA 7-19 GM/118ML RE ENEM: 1.0000 | ENEMA | RECTAL | Status: DC | PRN

## 2019-04-14 MED ORDER — OXYTOCIN 40 UNITS IN NORMAL SALINE INFUSION - SIMPLE MED: 1.0000 m[IU]/min | INTRAVENOUS | Status: DC

## 2019-04-14 MED ORDER — OXYTOCIN 40 UNITS IN NORMAL SALINE INFUSION - SIMPLE MED
2.5000 [IU]/h | INTRAVENOUS | Status: DC
  Administered 2019-04-14: 2.5 [IU]/h via INTRAVENOUS
  Filled 2019-04-14: qty 1000

## 2019-04-14 MED ORDER — OXYTOCIN BOLUS FROM INFUSION
500.0000 mL | Freq: Once | INTRAVENOUS | Status: AC
  Administered 2019-04-14: 500 mL via INTRAVENOUS

## 2019-04-14 MED ORDER — LACTATED RINGERS IV SOLN: 500.0000 mL | INTRAVENOUS | Status: DC | PRN

## 2019-04-14 MED ORDER — OXYCODONE-ACETAMINOPHEN 5-325 MG PO TABS: 2.0000 | ORAL_TABLET | ORAL | Status: DC | PRN

## 2019-04-14 MED ORDER — TERBUTALINE SULFATE 1 MG/ML IJ SOLN: 0.2500 mg | Freq: Once | INTRAMUSCULAR | Status: DC | PRN

## 2019-04-14 MED ORDER — SOD CITRATE-CITRIC ACID 500-334 MG/5ML PO SOLN: 30.0000 mL | ORAL | Status: DC | PRN

## 2019-04-14 MED ORDER — LACTATED RINGERS IV SOLN
INTRAVENOUS | Status: DC
  Administered 2019-04-14: 22:00:00 via INTRAVENOUS

## 2019-04-14 NOTE — Discharge Summary (Signed)
Postpartum Discharge Summary     Patient Name: Sheri Hill DOB: 12-Mar-1991 MRN: [id number]  Date of admission: 04/14/2019 Delivering Provider: Wende Mott   Date of discharge: 04/16/2019  Admitting diagnosis: CTX 3 min  Intrauterine pregnancy: 104w1d    Secondary diagnosis:  Active Problems:   Limited prenatal care   Oligohydramnios   [redacted] weeks gestation of pregnancy  Additional problems: n/a     Discharge diagnosis: Term Pregnancy Delivered                                                                                                Post partum procedures:None  Augmentation: AROM  Complications: None  Hospital course:  Onset of Labor With Vaginal Delivery     28y.o. yo [id number] 29w1das admitted in Latent Labor on 04/14/2019. Patient had an uncomplicated labor course as follows: progressed quickly to complete, AROM and pushed 10 minutes to deliver Membrane Rupture Time/Date: 10:07 PM ,04/14/2019   Intrapartum Procedures: Episiotomy: None [1]                                         Lacerations:  None [1]  Patient had a delivery of a Viable infant. 04/14/2019  Information for the patient's newborn:  KeBonnetta, Allbee0[[id number]]Delivery Method: Vaginal, Spontaneous(Filed from Delivery Summary)     Pateint had an uncomplicated postpartum course. Declined birth control. She is ambulating, tolerating a regular diet, passing flatus, and urinating well. Patient is discharged home in stable condition on 04/16/19.  Delivery time: 10:22 PM    Magnesium Sulfate received: No BMZ received: No Rhophylac:No MMR:No Transfusion:No  Physical exam  Vitals:   04/15/19 0945 04/15/19 1345 04/15/19 2121 04/16/19 0617  BP: 118/60 111/70 (!) 121/54 (!) 114/57  Pulse: 68 (!) 56 64 (!) 59  Resp: 18 18 18 16   Temp: 98.6 F (37 C) 98.6 F (37 C) 98.1 F (36.7 C) 97.6 F (36.4 C)  TempSrc: Oral  Oral Oral  SpO2:    100%  Weight:      Height:       General: alert,  cooperative and no distress Lochia: appropriate Uterine Fundus: firm DVT Evaluation: No evidence of DVT seen on physical exam. Labs: Lab Results  Component Value Date   WBC 13.8 (H) 04/15/2019   HGB 11.2 (L) 04/15/2019   HCT 33.4 (L) 04/15/2019   MCV 90.8 04/15/2019   PLT 177 04/15/2019   CMP Latest Ref Rng & Units 08/31/2017  Glucose 65 - 99 mg/dL 97  BUN 6 - 20 mg/dL 9  Creatinine 0.44 - 1.00 mg/dL 0.44  Sodium 135 - 145 mmol/L 136  Potassium 3.5 - 5.1 mmol/L 3.4(L)  Chloride 101 - 111 mmol/L 104  CO2 22 - 32 mmol/L 23  Calcium 8.9 - 10.3 mg/dL 9.0  Total Protein 6.5 - 8.1 g/dL 6.8  Total Bilirubin 0.3 - 1.2 mg/dL 0.4  Alkaline Phos 38 - 126 U/L 101  AST 15 -  41 U/L 18  ALT 14 - 54 U/L 14    Discharge instruction: per After Visit Summary and "Baby and Me Booklet".  After visit meds:  Allergies as of 04/16/2019   No Known Allergies     Medication List    TAKE these medications   prenatal multivitamin Tabs tablet Take 1 tablet by mouth daily at 12 noon.       Diet: routine diet  Activity: Advance as tolerated. Pelvic rest for 6 weeks.   Outpatient follow up:4 weeks Follow up Appt:No future appointments. Follow up Visit:  Please schedule this patient for PP visit in: 4 weeks Low risk pregnancy complicated by: n/a Delivery mode:  SVD Anticipated Birth Control:  other/unsure PP Procedures needed: n/a  Schedule Integrated [place] visit: no Provider: Any provider     Newborn Data: Live born female  Birth Weight: 2800g APGAR (1 MIN): 9   APGAR (5 MINS): 9   APGAR (10 MINS):    Newborn Delivery   Birth date/time: 04/14/2019 22:22:00 Delivery type: Vaginal, Spontaneous      Baby Feeding: Bottle and Breast Disposition:home with mother

## 2019-04-14 NOTE — H&P (Signed)
OBSTETRIC ADMISSION HISTORY AND PHYSICAL  Sheri Hill A Date is a 28 y.o. female G3P2002 with IUP at 5314w1d by LMP presenting for IOL for oligohydramnios, spontaneous onset of labor. She reports +FMs, No LOF, no VB, no blurry vision, headaches or peripheral edema, and RUQ pain.  She plans on breast and bottle feeding. She is unsure what she wants for birth control She [facility]   Dating: By LMP --->  Estimated Date of Delivery: 04/27/19  Prenatal History/Complications:  Past Medical History: Past Medical History:  Diagnosis Date  . Medical history non-contributory     Past Surgical History: Past Surgical History:  Procedure Laterality Date  . NO PAST SURGERIES      Obstetrical History: OB History    Gravida  3   Para  2   Term  2   Preterm      AB      Living  2     SAB      TAB      Ectopic      Multiple      Live Births  2           Social History Social History   Socioeconomic History  . Marital status: Married    Spouse name: Not on file  . Number of children: Not on file  . Years of education: Not on file  . Highest education level: Not on file  Occupational History  . Not on file  Social Needs  . Financial resource strain: Not on file  . Food insecurity    Worry: Not on file    Inability: Not on file  . Transportation needs    Medical: Not on file    Non-medical: Not on file  Tobacco Use  . Smoking status: Never Smoker  . Smokeless tobacco: Never Used  Substance and Sexual Activity  . Alcohol use: No  . Drug use: No  . Sexual activity: Yes    Birth control/protection: None  Lifestyle  . Physical activity    Days per week: Not on file    Minutes per session: Not on file  . Stress: Not on file  Relationships  . Social Musicianconnections    Talks on phone: Not on file    Gets together: Not on file    Attends religious service: Not on file    Active member of club or organization: Not on file   Attends meetings of clubs or organizations: Not on file    Relationship status: Not on file  Other Topics Concern  . Not on file  Social History Narrative  . Not on file    Family History: Family History  Problem Relation Age of Onset  . Cancer Neg Hx   . Diabetes Neg Hx   . Heart failure Neg Hx     Allergies: No Known Allergies  Medications Prior to Admission  Medication Sig Dispense Refill Last Dose  . Prenatal Vit-Fe Fumarate-FA (PRENATAL MULTIVITAMIN) TABS tablet Take 1 tablet by mouth daily at 12 noon.        Review of Systems   All systems reviewed and negative except as stated in HPI  Blood pressure (!) 103/58, pulse 85, temperature 98.5 F (36.9 C), temperature source Oral, resp. rate 16, last menstrual period 07/21/2018, SpO2 100 %, not currently breastfeeding. General appearance: alert, cooperative and no distress Lungs: clear to auscultation bilaterally Heart: regular rate and rhythm Abdomen: soft, non-tender; bowel  sounds normal Pelvic: n/a Extremities: Homans sign is negative, no sign of DVT DTR's +2 Presentation: cephalic Fetal monitoringBaseline: 130 bpm, Variability: Good {> 6 bpm), Accelerations: Reactive and Decelerations: Absent Uterine activityFrequency: Every 3-4 minutes Dilation: 10 Effacement (%): 100 Station: Plus 1 Exam by:: Len Blalock, CNM   Prenatal labs: ABO, Rh: --/--/O POS, O POS Performed at [facility], [place] [address], [place], [place] [address]  [phone]) Antibody: NEG (10/30 2058) Rubella:   RPR:    HBsAg: NON REACTIVE (10/30 2058)  HIV: NON REACTIVE (10/30 2058)  GBS:     Prenatal labs pending  Prenatal Transfer Tool  Maternal Diabetes: Unknown Genetic Screening: Declined Maternal Ultrasounds/Referrals: Normal Fetal Ultrasounds or other Referrals:  None Maternal Substance Abuse:  No Significant Maternal Medications:  None Significant Maternal Lab Results: Unknown  Results for orders placed or  performed during the hospital encounter of 04/14/19 (from the past 24 hour(s))  SARS Coronavirus 2 by RT PCR (hospital order, performed in [facility] lab) Nasopharyngeal Nasopharyngeal Swab   Collection Time: 04/14/19  8:58 PM   Specimen: Nasopharyngeal Swab  Result Value Ref Range   SARS Coronavirus 2 NEGATIVE NEGATIVE  Hepatitis B surface antigen   Collection Time: 04/14/19  8:58 PM  Result Value Ref Range   Hepatitis B Surface Ag NON REACTIVE NON REACTIVE  CBC   Collection Time: 04/14/19  8:58 PM  Result Value Ref Range   WBC 12.0 (H) 4.0 - 10.5 K/uL   RBC 4.17 3.87 - 5.11 MIL/uL   Hemoglobin 12.8 12.0 - 15.0 g/dL   HCT 38.3 36.0 - 46.0 %   MCV 91.8 80.0 - 100.0 fL   MCH 30.7 26.0 - 34.0 pg   MCHC 33.4 30.0 - 36.0 g/dL   RDW 13.2 11.5 - 15.5 %   Platelets 220 150 - 400 K/uL   nRBC 0.0 0.0 - 0.2 %  Differential   Collection Time: 04/14/19  8:58 PM  Result Value Ref Range   Neutrophils Relative % 80 %   Neutro Abs 9.5 (H) 1.7 - 7.7 K/uL   Lymphocytes Relative 13 %   Lymphs Abs 1.6 0.7 - 4.0 K/uL   Monocytes Relative 6 %   Monocytes Absolute 0.8 0.1 - 1.0 K/uL   Eosinophils Relative 1 %   Eosinophils Absolute 0.1 0.0 - 0.5 K/uL   Basophils Relative 0 %   Basophils Absolute 0.0 0.0 - 0.1 K/uL   Immature Granulocytes 0 %   Abs Immature Granulocytes 0.04 0.00 - 0.07 K/uL  Rapid HIV screen (HIV 1/2 Ab+Ag)   Collection Time: 04/14/19  8:58 PM  Result Value Ref Range   HIV-1 P24 Antigen - HIV24 NON REACTIVE NON REACTIVE   HIV 1/2 Antibodies NON REACTIVE NON REACTIVE   Interpretation (HIV Ag Ab)      A non reactive test result means that HIV 1 or HIV 2 antibodies and HIV 1 p24 antigen were not detected in the specimen.  Type and screen [place]   Collection Time: 04/14/19  8:58 PM  Result Value Ref Range   ABO/RH(D) O POS    Antibody Screen NEG    Sample Expiration      04/17/2019,2359 Performed at [facility], [place] [address],  [place], [place] [address]   ABO/Rh   Collection Time: 04/14/19  8:58 PM  Result Value Ref Range   ABO/RH(D)      O POS Performed at [facility],  [address] [place], [place] [address]     Patient Active Problem List   Diagnosis Date Noted  . Limited prenatal care 04/14/2019  . Oligohydramnios 04/14/2019  . Antenatal screening for malformation using ultrasonics 01/11/2019  . [redacted] weeks gestation of pregnancy 01/11/2019  . Umbilical hernia 08/23/2017    Assessment/Plan:  RICKI VANHANDEL is a 28 y.o. G3P2002 at [redacted]w[redacted]d here for IOL for oligohydramnios, early labor  #Labor: will start pitocin #Pain: Per patient request #FWB: Cat 1 #ID:  GBS unknown, no hx GBS, will get PCR #MOF: both #MOC: unsure #Circ:  n/a  Rolm Bookbinder, CNM  04/14/2019, 10:31 PM

## 2019-04-15 ENCOUNTER — Encounter (HOSPITAL_COMMUNITY): Payer: Self-pay

## 2019-04-15 LAB — CBC
HCT: 33.4 % — ABNORMAL LOW (ref 36.0–46.0)
Hemoglobin: 11.2 g/dL — ABNORMAL LOW (ref 12.0–15.0)
MCH: 30.4 pg (ref 26.0–34.0)
MCHC: 33.5 g/dL (ref 30.0–36.0)
MCV: 90.8 fL (ref 80.0–100.0)
Platelets: 177 10*3/uL (ref 150–400)
RBC: 3.68 MIL/uL — ABNORMAL LOW (ref 3.87–5.11)
RDW: 13.2 % (ref 11.5–15.5)
WBC: 13.8 10*3/uL — ABNORMAL HIGH (ref 4.0–10.5)
nRBC: 0 % (ref 0.0–0.2)

## 2019-04-15 LAB — RPR: RPR Ser Ql: NONREACTIVE

## 2019-04-15 LAB — RAPID URINE DRUG SCREEN, HOSP PERFORMED
Amphetamines: NOT DETECTED
Barbiturates: NOT DETECTED
Benzodiazepines: NOT DETECTED
Cocaine: NOT DETECTED
Opiates: NOT DETECTED
Tetrahydrocannabinol: NOT DETECTED

## 2019-04-15 MED ORDER — DIBUCAINE (PERIANAL) 1 % EX OINT: 1.0000 "application " | TOPICAL_OINTMENT | CUTANEOUS | Status: DC | PRN

## 2019-04-15 MED ORDER — IBUPROFEN 600 MG PO TABS
600.0000 mg | ORAL_TABLET | Freq: Four times a day (QID) | ORAL | Status: DC
  Administered 2019-04-15 – 2019-04-16 (×6): 600 mg via ORAL
  Filled 2019-04-15 (×7): qty 1

## 2019-04-15 MED ORDER — ZOLPIDEM TARTRATE 5 MG PO TABS: 5.0000 mg | ORAL_TABLET | Freq: Every evening | ORAL | Status: DC | PRN

## 2019-04-15 MED ORDER — PRENATAL MULTIVITAMIN CH
1.0000 | ORAL_TABLET | Freq: Every day | ORAL | Status: DC
  Administered 2019-04-15 – 2019-04-16 (×2): 1 via ORAL
  Filled 2019-04-15 (×2): qty 1

## 2019-04-15 MED ORDER — ACETAMINOPHEN 325 MG PO TABS: 650.0000 mg | ORAL_TABLET | ORAL | Status: DC | PRN

## 2019-04-15 MED ORDER — ONDANSETRON HCL 4 MG/2ML IJ SOLN: 4.0000 mg | INTRAMUSCULAR | Status: DC | PRN

## 2019-04-15 MED ORDER — COCONUT OIL OIL: 1.0000 "application " | TOPICAL_OIL | Status: DC | PRN

## 2019-04-15 MED ORDER — DIPHENHYDRAMINE HCL 25 MG PO CAPS: 25.0000 mg | ORAL_CAPSULE | Freq: Four times a day (QID) | ORAL | Status: DC | PRN

## 2019-04-15 MED ORDER — SENNOSIDES-DOCUSATE SODIUM 8.6-50 MG PO TABS
2.0000 | ORAL_TABLET | ORAL | Status: DC
  Administered 2019-04-15: 2 via ORAL
  Filled 2019-04-15 (×2): qty 2

## 2019-04-15 MED ORDER — TETANUS-DIPHTH-ACELL PERTUSSIS 5-2.5-18.5 LF-MCG/0.5 IM SUSP: 0.5000 mL | Freq: Once | INTRAMUSCULAR | Status: DC

## 2019-04-15 MED ORDER — WITCH HAZEL-GLYCERIN EX PADS: 1.0000 "application " | MEDICATED_PAD | CUTANEOUS | Status: DC | PRN

## 2019-04-15 MED ORDER — SIMETHICONE 80 MG PO CHEW: 80.0000 mg | CHEWABLE_TABLET | ORAL | Status: DC | PRN

## 2019-04-15 MED ORDER — BENZOCAINE-MENTHOL 20-0.5 % EX AERO: 1.0000 "application " | INHALATION_SPRAY | CUTANEOUS | Status: DC | PRN

## 2019-04-15 MED ORDER — ONDANSETRON HCL 4 MG PO TABS: 4.0000 mg | ORAL_TABLET | ORAL | Status: DC | PRN

## 2019-04-15 NOTE — Lactation Note (Signed)
This note was copied from a baby's chart. Lactation Consultation Note  Patient Name: Girl Jamyra Zweig [id number] Date: 04/15/2019 Reason for consult: Initial assessment;Early term 37-38.6wks  LC in to visit with P3 Mom of ET infant at 76 hrs old.  Mom states baby is latching and feeding well.  Baby's latch score from L&D was a 10.  Mom denies any discomfort with latch and understands importance of a wide, deep latch to breast.   Baby sleeping swaddled in crib.    Encouraged keeping baby STS to encouraged frequent feedings.  Mom to offer breast with cues.  Shared to Mom that goal is to feed >8 times per 24 hrs.  Mom smiling and nodding.  Lactation brochure given to Mom.  Phone numbers of support identified. Mom aware of IP and OP lactation support available to her.  Encouraged her to call prn for assistance.  Interventions Interventions: Breast feeding basics reviewed;Skin to skin;Breast massage;Hand express  Lactation Tools Discussed/Used WIC Program: Yes   Consult Status Consult Status: Follow-up Date: 04/16/19 Follow-up type: In-patient    Broadus John 04/15/2019, 3:31 PM

## 2019-04-15 NOTE — Progress Notes (Signed)
Post Partum Day 1 Subjective: no complaints, up ad lib, voiding, tolerating PO and + flatus  Objective: Blood pressure (!) 110/53, pulse 64, temperature 98 F (36.7 C), temperature source Oral, resp. rate 18, height 5\' 4"  (1.626 m), weight 53.1 kg, last menstrual period 07/21/2018, SpO2 100 %, unknown if currently breastfeeding.  Physical Exam:  General: alert, cooperative and no distress Lochia: appropriate Uterine Fundus: firm Incision: n/a DVT Evaluation: No evidence of DVT seen on physical exam.  Recent Labs    04/14/19 2058  HGB 12.8  HCT 38.3    Assessment/Plan: Plan for discharge tomorrow, Breastfeeding and Contraception unsure, discussed options   LOS: 1 day   [place] 04/15/2019, 6:27 AM

## 2019-04-15 NOTE — Clinical Social Work Maternal (Signed)
CLINICAL SOCIAL WORK MATERNAL/CHILD NOTE  Patient Details  Name: Sheri Hill MRN: [id number] Date of Birth: 10/29/1990  Date:  09-16-18  Clinical Social Worker Initiating Note:  Laurey Arrow Date/Time: Initiated:  04/15/19/1203     Child's Name:  Sheri Hill   Biological Parents:  Mother, Father   Need for Interpreter:  None   Reason for Referral:  Late or No Prenatal Care    Address:  [place] [address]    Phone number:  [phone] (home)     Additional phone number: FOB's Number is [phone]  Household Members/Support Persons (HM/SP):   Household Member/Support Person 1, Household Member/Support Person 2, Household Member/Support Person 3   HM/SP Name Relationship DOB or Age  HM/SP -1 Sheri Hill Husband/FOB 03/19/1987  HM/SP -2 Sheri Hill daughter 12/09/2015  HM/SP -3 Sheri Hill daughter 11/09/2017  HM/SP -4        HM/SP -5        HM/SP -6        HM/SP -7        HM/SP -8          Natural Supports (not living in the home):  Extended Family, Immediate Family, Parent(The couple also reported that FOB family will provide support if needed.)   Professional Supports: None   Employment: Unemployed   Type of Work:     Education:  Programmer, systems   Homebound arranged:    Museum/gallery curator Resources:  [employer]   Other Resources:  Physicist, medical (CSW provided the family with information to apply for [facility].)   Cultural/Religious Consideration: None Reported Strengths:  Ability to meet basic needs , Engineer, materials, Home prepared for child    Psychotropic Medications:         Pediatrician:    [place](MOB reported infant will receive follow-up care at [place].)  Pediatrician List:   [facility]      Pediatrician Fax Number:    Risk Factors/Current Problems:      Cognitive State:  Alert ,  Able to Concentrate , Linear Thinking    Mood/Affect:  Comfortable , Calm , Interested , Flat , Relaxed , Apprehensive    CSW Assessment: CSW met with MOB in room 413 to complete an assessment for limited PNC.  When CSW arrived MOB was bonding with infant as evidence by holding infant and engaging in infant massages.  FOB was also present and was in the rest room initially; MOB gave CSW permission to complete the assessment while FOB was present.  FOB engaged with CSW during the assessment. The couple was polite and appeared to be supportive of one another. However during the assessment MOB consistently looked to FOB for confirmation and reassurance.   CSW asked about MOB's limited PNC and MOB stated, "There is no reason, I just didn't go."  FOB attributed MOB's lack of care to COVID-19 and the provider not calling MOB back for a scheduled appointments.  The couple denied barriers or concerns regarding follow-up care for infant and reported that care will be received at [facility].  CSW informed couple of hospita'sl policy regarding limited PNC and they were understanding. MOB denied the use of all illicit substances and reported not being concerned regarding drug screens for infant.  MOB and FOB denied CPS hx.   The  couple reported having all essential items to care for infant and reported having a good support team.   CSW will continue to monitor infant's UDS and CDS and will make are report if warranted.   There are no barriers to discharge.    CSW Plan/Description:  No Further Intervention Required/No Barriers to Discharge, Sudden Infant Death Syndrome (SIDS) Education, Perinatal Mood and Anxiety Disorder (PMADs) Education, [place], CSW Will Continue to Monitor Umbilical Cord Tissue Drug Screen Results and Make Report if Warranted   Laurey Arrow, MSW, LCSW Clinical Social Work [phone]   Dimple Nanas,  [place] 04/15/2019, 12:10 PM

## 2019-04-16 DIAGNOSIS — Z3A38 38 weeks gestation of pregnancy: Secondary | ICD-10-CM

## 2019-04-16 LAB — RUBELLA SCREEN: Rubella: 1.48 index (ref >0.99)

## 2019-04-17 ENCOUNTER — Ambulatory Visit: Payer: Self-pay

## 2019-04-17 NOTE — Lactation Note (Signed)
This note was copied from a baby's chart. Lactation Consultation Note Baby 54 hrs old. Mom states BF going well. Denies painful latches or sore nipples. Mom states has no questions or concerns.  Reviewed engorgement, milk storage, breast massage, post pumping, mature milk, I&O. Mom denies need for assistance w/BF. Hoping for D/C home today. Has no further need for Lactation. Reminded of OP services if needed.  Patient Name: Sheri Hill JOACZ'Y Date: 04/17/2019 Reason for consult: Follow-up assessment   Maternal Data    Feeding Feeding Type: Breast Fed  LATCH Score Latch: Grasps breast easily, tongue down, lips flanged, rhythmical sucking.  Audible Swallowing: Spontaneous and intermittent  Type of Nipple: Everted at rest and after stimulation  Comfort (Breast/Nipple): Soft / non-tender  Hold (Positioning): No assistance needed to correctly position infant at breast.  LATCH Score: 10  Interventions    Lactation Tools Discussed/Used     Consult Status Consult Status: Complete Date: 04/17/19    Theodoro Kalata 04/17/2019, 3:14 AM

## 2019-04-17 NOTE — Lactation Note (Signed)
This note was copied from a baby's chart. Lactation Consultation Note  Patient Name: Girl Anneke Cundy [id number] Date: 04/17/2019 Reason for consult: Follow-up assessment;Early term 37-38.6wks  LC in to visit with P3 Mom and FOB on day of discharge.  Mom states baby is latching and feeding well.  Baby's weight increased by 74 gm from yesterday.  Mom's breasts are heavier, denies engorgement.  Hand pump given with instructions on use and care.  Encouraged STS and cue based feedings, with a goal of 8-12 feedings per 24 hrs.  Engorgement prevention and treatment reviewed.    Mom denies any questions and is ready to be discharged.  Mom shown phone numbers on brochure to call for concerns with breastfeeding.  Mom aware of OP lactation support.   Interventions Interventions: Breast feeding basics reviewed;Skin to skin;Breast massage;Hand express;Hand pump  Lactation Tools Discussed/Used Initiated by:: Cipriano Mile RN [place] Date initiated:: 04/17/19   Consult Status Consult Status: Complete Date: 04/17/19 Follow-up type: Call as needed    Broadus John 04/17/2019, 8:28 AM

## 2019-05-25 ENCOUNTER — Telehealth: Payer: Medicaid Other | Admitting: Advanced Practice Midwife

## 2020-02-12 ENCOUNTER — Other Ambulatory Visit: Payer: Self-pay

## 2020-02-12 ENCOUNTER — Emergency Department (HOSPITAL_COMMUNITY)
Admission: EM | Admit: 2020-02-12 | Discharge: 2020-02-13 | Disposition: A | Discharge status: Other Acute Inpatient Hospital | Payer: Medicaid Other | Attending: Emergency Medicine | Admitting: Emergency Medicine

## 2020-02-12 ENCOUNTER — Encounter (HOSPITAL_COMMUNITY): Payer: Self-pay

## 2020-02-12 DIAGNOSIS — F341 Dysthymic disorder: Secondary | ICD-10-CM | POA: Diagnosis not present

## 2020-02-12 DIAGNOSIS — F329 Major depressive disorder, single episode, unspecified: Secondary | ICD-10-CM | POA: Insufficient documentation

## 2020-02-12 DIAGNOSIS — Z20822 Contact with and (suspected) exposure to covid-19: Secondary | ICD-10-CM | POA: Insufficient documentation

## 2020-02-12 DIAGNOSIS — T620X1A Toxic effect of ingested mushrooms, accidental (unintentional), initial encounter: Secondary | ICD-10-CM | POA: Insufficient documentation

## 2020-02-12 DIAGNOSIS — R45851 Suicidal ideations: Secondary | ICD-10-CM | POA: Insufficient documentation

## 2020-02-12 DIAGNOSIS — R9431 Abnormal electrocardiogram [ECG] [EKG]: Secondary | ICD-10-CM | POA: Diagnosis not present

## 2020-02-12 LAB — COMPREHENSIVE METABOLIC PANEL
ALT: 16 U/L (ref 0–44)
AST: 14 U/L — ABNORMAL LOW (ref 15–41)
Albumin: 4.6 g/dL (ref 3.5–5.0)
Alkaline Phosphatase: 40 U/L (ref 38–126)
Anion gap: 12 (ref 5–15)
BUN: 14 mg/dL (ref 6–20)
CO2: 22 mmol/L (ref 22–32)
Calcium: 9.2 mg/dL (ref 8.9–10.3)
Chloride: 108 mmol/L (ref 98–111)
Creatinine, Ser: 0.55 mg/dL (ref 0.44–1.00)
GFR calc Af Amer: >60 mL/min (ref >60)
GFR calc non Af Amer: >60 mL/min (ref >60)
Glucose, Bld: 94 mg/dL (ref 70–99)
Potassium: 3.9 mmol/L (ref 3.5–5.1)
Sodium: 142 mmol/L (ref 135–145)
Total Bilirubin: 0.9 mg/dL (ref 0.3–1.2)
Total Protein: 7.5 g/dL (ref 6.5–8.1)

## 2020-02-12 LAB — SALICYLATE LEVEL: Salicylate Lvl: <7 mg/dL — ABNORMAL LOW (ref 7.0–30.0)

## 2020-02-12 LAB — RAPID URINE DRUG SCREEN, HOSP PERFORMED
Amphetamines: NOT DETECTED
Barbiturates: NOT DETECTED
Benzodiazepines: NOT DETECTED
Cocaine: NOT DETECTED
Opiates: NOT DETECTED
Tetrahydrocannabinol: NOT DETECTED

## 2020-02-12 LAB — CBC
HCT: 37.9 % (ref 36.0–46.0)
Hemoglobin: 12.2 g/dL (ref 12.0–15.0)
MCH: 28.8 pg (ref 26.0–34.0)
MCHC: 32.2 g/dL (ref 30.0–36.0)
MCV: 89.6 fL (ref 80.0–100.0)
Platelets: 236 10*3/uL (ref 150–400)
RBC: 4.23 MIL/uL (ref 3.87–5.11)
RDW: 12.5 % (ref 11.5–15.5)
WBC: 6 10*3/uL (ref 4.0–10.5)
nRBC: 0 % (ref 0.0–0.2)

## 2020-02-12 LAB — ACETAMINOPHEN LEVEL: Acetaminophen (Tylenol), Serum: <10 ug/mL — ABNORMAL LOW (ref 10–30)

## 2020-02-12 LAB — ETHANOL: Alcohol, Ethyl (B): <10 mg/dL (ref <10)

## 2020-02-12 LAB — PREGNANCY, URINE: Preg Test, Ur: NEGATIVE

## 2020-02-12 NOTE — ED Triage Notes (Addendum)
Pt states she has been feeling suicidal for a while now. She says that she wants to kill her self by eating wild mushrooms, pt was brought in by [facility] voluntarily. She called them for help.  Pt states she went out to the woods and got some white mushrooms. She says she ate two of them.

## 2020-02-12 NOTE — ED Notes (Signed)
Pt changed into purple scrubs. One bag of belongings placed on 4th shelf in locker room

## 2020-02-12 NOTE — ED Notes (Signed)
Pt wanded second time by security  

## 2020-02-12 NOTE — ED Notes (Signed)
Poison Control notified and recommended an EKG and observation for 8 hours. Monitor for GI symptoms.

## 2020-02-12 NOTE — ED Provider Notes (Signed)
[facility] EMERGENCY DEPARTMENT Provider Note   CSN: [id number] Arrival date & time: 02/12/20  1749     History Chief Complaint  Patient presents with  . Suicidal  . Ingestion    Wild Mushroom     Sheri Hill is a 29 y.o. female.  Patient states she ate 2 white mushrooms because she was suicidal.  Then she called for help.  Patient denies being suicidal now  The history is provided by the patient and medical records. No language interpreter was used.  Ingestion This is a new problem. The current episode started 3 to 5 hours ago. The problem occurs rarely. The problem has been resolved. Pertinent negatives include no chest pain, no abdominal pain and no headaches. Nothing aggravates the symptoms. Nothing relieves the symptoms. She has tried nothing for the symptoms.       Past Medical History:  Diagnosis Date  . Medical history non-contributory     Patient Active Problem List   Diagnosis Date Noted  . [redacted] weeks gestation of pregnancy 04/16/2019  . Limited prenatal care 04/14/2019  . Oligohydramnios 04/14/2019  . Antenatal screening for malformation using ultrasonics 01/11/2019  . [redacted] weeks gestation of pregnancy 01/11/2019  . Umbilical hernia 08/23/2017    Past Surgical History:  Procedure Laterality Date  . NO PAST SURGERIES       OB History    Gravida  3   Para  3   Term  3   Preterm      AB      Living  3     SAB      TAB      Ectopic      Multiple  0   Live Births  3           Family History  Problem Relation Age of Onset  . Cancer Neg Hx   . Diabetes Neg Hx   . Heart failure Neg Hx     Social History   Tobacco Use  . Smoking status: Never Smoker  . Smokeless tobacco: Never Used  Vaping Use  . Vaping Use: Never used  Substance Use Topics  . Alcohol use: No  . Drug use: No    Home Medications Prior to Admission medications   Medication Sig Start Date End Date Taking? Authorizing Provider  Prenatal Vit-Fe Fumarate-FA  (PRENATAL MULTIVITAMIN) TABS tablet Take 1 tablet by mouth daily at 12 noon.    [provider]    Allergies    Patient has no known allergies.  Review of Systems   Review of Systems  Constitutional: Negative for appetite change and fatigue.  HENT: Negative for congestion, ear discharge and sinus pressure.   Eyes: Negative for discharge.  Respiratory: Negative for cough.   Cardiovascular: Negative for chest pain.  Gastrointestinal: Negative for abdominal pain and diarrhea.  Genitourinary: Negative for frequency and hematuria.  Musculoskeletal: Negative for back pain.  Skin: Negative for rash.  Neurological: Negative for seizures and headaches.  Psychiatric/Behavioral: Positive for dysphoric mood. Negative for hallucinations.    Physical Exam Updated Vital Signs BP 123/88   Pulse 64   Temp 98.3 F (36.8 C)   Resp 17   Ht 5\' 4"  (1.626 m)   Wt 43.1 kg   LMP 01/24/2020 (Exact Date)   SpO2 98%   BMI 16.31 kg/m   Physical Exam Vitals and nursing note reviewed.  Constitutional:      Appearance: She is well-developed.  HENT:  Head: Normocephalic.     Nose: Nose normal.  Eyes:     General: No scleral icterus.    Conjunctiva/sclera: Conjunctivae normal.  Neck:     Thyroid: No thyromegaly.  Cardiovascular:     Rate and Rhythm: Normal rate and regular rhythm.     Heart sounds: No murmur heard.  No friction rub. No gallop.   Pulmonary:     Breath sounds: No stridor. No wheezing or rales.  Chest:     Chest wall: No tenderness.  Abdominal:     General: There is no distension.     Tenderness: There is no abdominal tenderness. There is no rebound.  Musculoskeletal:        General: Normal range of motion.     Cervical back: Neck supple.  Lymphadenopathy:     Cervical: No cervical adenopathy.  Skin:    Findings: No erythema or rash.  Neurological:     Mental Status: She is alert and oriented to person, place, and time.     Motor: No abnormal muscle tone.      Coordination: Coordination normal.  Psychiatric:     Comments: Depressed and not suicidal     ED Results / Procedures / Treatments   Labs (all labs ordered are listed, but only abnormal results are displayed) Labs Reviewed  COMPREHENSIVE METABOLIC PANEL - Abnormal; Notable for the following components:      Result Value   AST 14 (*)    All other components within normal limits  SALICYLATE LEVEL - Abnormal; Notable for the following components:   Salicylate Lvl <7.0 (*)    All other components within normal limits  ACETAMINOPHEN LEVEL - Abnormal; Notable for the following components:   Acetaminophen (Tylenol), Serum <10 (*)    All other components within normal limits  ETHANOL  CBC  RAPID URINE DRUG SCREEN, HOSP PERFORMED  PREGNANCY, URINE    EKG None  Radiology No results found.  Procedures Procedures (including critical care time)  Medications Ordered in ED Medications - No data to display  ED Course  I have reviewed the triage vital signs and the nursing notes.  Pertinent labs & imaging results that were available during my care of the patient were reviewed by me and considered in my medical decision making (see chart for details).    MDM Rules/Calculators/A&P                          Patient with suicidal ideation and ingestion of mushrooms.  Poison control was called and she is to be observed until midnight.  She is also going to speak to behavioral health.  The patient denies suicidal ideation now      This patient presents to the ED for concern of suicidal ideation, this involves an extensive number of treatment options, and is a complaint that carries with it a high risk of complications and morbidity.  The differential diagnosis includes suicidal depression   Lab Tests:   I Ordered, reviewed, and interpreted labs, which included BC chemistries which were unremarkable  Medicines ordered:     Imaging Studies ordered:       Additional  history obtained:   Additional history obtained from records  Previous records obtained and reviewed.  Consultations Obtained:   I consulted gave her home and discussed lab and imaging findings  Reevaluation:  After the interventions stated above, I reevaluated the patient and found no change  Critical Interventions:  .  Final Clinical Impression(s) / ED Diagnoses Final diagnoses:  Suicidal ideation    Rx / DC Orders ED Discharge Orders    None       Bethann Berkshire, MD 02/15/20 1027

## 2020-02-13 ENCOUNTER — Inpatient Hospital Stay (HOSPITAL_COMMUNITY)
Admission: AD | Admit: 2020-02-13 | Discharge: 2020-02-16 | DRG: 881 | Disposition: A | Discharge status: Home / Self Care | Payer: Medicaid Other | Source: Intra-hospital | Attending: Psychiatry | Admitting: Psychiatry

## 2020-02-13 ENCOUNTER — Encounter (HOSPITAL_COMMUNITY): Payer: Self-pay | Admitting: Psychiatry

## 2020-02-13 DIAGNOSIS — F819 Developmental disorder of scholastic skills, unspecified: Secondary | ICD-10-CM | POA: Diagnosis present

## 2020-02-13 DIAGNOSIS — T1491XA Suicide attempt, initial encounter: Secondary | ICD-10-CM | POA: Diagnosis not present

## 2020-02-13 DIAGNOSIS — Z79899 Other long term (current) drug therapy: Secondary | ICD-10-CM | POA: Diagnosis not present

## 2020-02-13 DIAGNOSIS — F419 Anxiety disorder, unspecified: Secondary | ICD-10-CM | POA: Diagnosis present

## 2020-02-13 DIAGNOSIS — T620X1A Toxic effect of ingested mushrooms, accidental (unintentional), initial encounter: Secondary | ICD-10-CM | POA: Diagnosis not present

## 2020-02-13 DIAGNOSIS — F329 Major depressive disorder, single episode, unspecified: Principal | ICD-10-CM

## 2020-02-13 DIAGNOSIS — R45851 Suicidal ideations: Secondary | ICD-10-CM | POA: Diagnosis present

## 2020-02-13 DIAGNOSIS — Z915 Personal history of self-harm: Secondary | ICD-10-CM | POA: Diagnosis not present

## 2020-02-13 DIAGNOSIS — F341 Dysthymic disorder: Secondary | ICD-10-CM | POA: Diagnosis not present

## 2020-02-13 DIAGNOSIS — F331 Major depressive disorder, recurrent, moderate: Secondary | ICD-10-CM | POA: Diagnosis not present

## 2020-02-13 DIAGNOSIS — Z20822 Contact with and (suspected) exposure to covid-19: Secondary | ICD-10-CM | POA: Diagnosis not present

## 2020-02-13 DIAGNOSIS — Z818 Family history of other mental and behavioral disorders: Secondary | ICD-10-CM

## 2020-02-13 LAB — SARS CORONAVIRUS 2 BY RT PCR (HOSPITAL ORDER, PERFORMED IN ~~LOC~~ HOSPITAL LAB): SARS Coronavirus 2: NEGATIVE

## 2020-02-13 MED ORDER — HYDROXYZINE HCL 25 MG PO TABS
25.0000 mg | ORAL_TABLET | Freq: Three times a day (TID) | ORAL | Status: DC | PRN
  Filled 2020-02-13: qty 1

## 2020-02-13 MED ORDER — ACETAMINOPHEN 325 MG PO TABS: 650.0000 mg | ORAL_TABLET | Freq: Four times a day (QID) | ORAL | Status: DC | PRN

## 2020-02-13 MED ORDER — TRAZODONE HCL 50 MG PO TABS
50.0000 mg | ORAL_TABLET | Freq: Every evening | ORAL | Status: DC | PRN
  Filled 2020-02-13: qty 1

## 2020-02-13 MED ORDER — ALUM & MAG HYDROXIDE-SIMETH 200-200-20 MG/5ML PO SUSP: 30.0000 mL | ORAL | Status: DC | PRN

## 2020-02-13 MED ORDER — MAGNESIUM HYDROXIDE 400 MG/5ML PO SUSP: 30.0000 mL | Freq: Every day | ORAL | Status: DC | PRN

## 2020-02-13 NOTE — ED Notes (Signed)
Pt eating breakfast and watching tv. Pt ate 50% of breakfast plus yogurt.

## 2020-02-13 NOTE — Progress Notes (Signed)
   02/13/20 2130  Psych Admission Type (Psych Patients Only)  Admission Status Voluntary  Psychosocial Assessment  Patient Complaints None  Eye Contact Brief  Facial Expression Animated  Affect Anxious  Speech Soft  Interaction Childlike;Submissive  Motor Activity Other (Comment) (wnl)  Appearance/Hygiene Unremarkable  Behavior Characteristics Cooperative  Mood Pleasant;Silly  Thought Process  Coherency WDL  Content WDL  Delusions None reported or observed  Perception WDL  Hallucination None reported or observed  Judgment Poor  Confusion None  Danger to Self  Current suicidal ideation? Denies  Danger to Others  Danger to Others None reported or observed   Pt denies SI, HI, AVH and pain. Pt laughs inappropriately at times.

## 2020-02-13 NOTE — ED Notes (Signed)
TTS being conducted at this time. 

## 2020-02-13 NOTE — ED Notes (Signed)
Pt ate most of her lunch and is now dozing off to sleep.
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
## 2020-02-13 NOTE — ED Notes (Signed)
Poison control advised that they were going to close out the patient case on their end. They did advise if patient is transferred to [facility] or placed at a facility that she would need a repeat CMET if she experiences any GI  symptoms within the next 24 hours.

## 2020-02-13 NOTE — BH Assessment (Addendum)
Patient accepted to [facility], pending negative COVID results.   Once, patient's negative COVID results have returned nursing staff will be provided with [facility] acceptance details.   Discussed with patient's nurse, Meagan.
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
## 2020-02-13 NOTE — Progress Notes (Signed)
Adult Psychoeducational Group Note  Date:  02/13/2020 Time:  10:12 PM  Group Topic/Focus:  Wrap-Up Group:   The focus of this group is to help patients review their daily goal of treatment and discuss progress on daily workbooks.  Participation Level:  Active  Participation Quality:  Appropriate  Affect:  Appropriate  Cognitive:  Appropriate  Insight: Appropriate  Engagement in Group:  Engaged  Modes of Intervention:  Discussion  Additional Comments:  Pt attend wrap up group. Her day was a 7. The one positive thing that happen meeting friendly people. This makes her feel better.  Charna Busman Long 02/13/2020, 10:12 PM

## 2020-02-13 NOTE — BH Assessment (Signed)
Patient accepted to [facility] unit. Room #303-2. The accepting provider is [name], NP. the attending provider is Dr. Jola Babinski. Nurse report [phone].

## 2020-02-13 NOTE — ED Notes (Signed)
Called Safe Transport to transfer Pt to [facility].

## 2020-02-14 ENCOUNTER — Telehealth: Payer: Self-pay | Admitting: *Deleted

## 2020-02-14 DIAGNOSIS — R45851 Suicidal ideations: Secondary | ICD-10-CM

## 2020-02-14 MED ORDER — SERTRALINE HCL 25 MG PO TABS
25.0000 mg | ORAL_TABLET | Freq: Every day | ORAL | Status: DC
  Administered 2020-02-14 – 2020-02-16 (×3): 25 mg via ORAL
  Filled 2020-02-14 (×6): qty 1

## 2020-02-14 MED ORDER — BUSPIRONE HCL 5 MG PO TABS
5.0000 mg | ORAL_TABLET | Freq: Two times a day (BID) | ORAL | Status: DC
  Administered 2020-02-14 – 2020-02-16 (×5): 5 mg via ORAL
  Filled 2020-02-14 (×10): qty 1

## 2020-02-14 NOTE — Progress Notes (Signed)
[facility] Group Notes:  (Nursing/MHT/Case Management/Adjunct)  Date:  02/14/2020  Time:  2030 Type of Therapy:  wrap up group  Participation Level:  Active  Participation Quality:  Appropriate, Attentive, Sharing and Supportive  Affect:  Anxious, Excited, Labile and Not Congruent  Cognitive:  Alert  Insight:  Improving  Engagement in Group:  Engaged  Modes of Intervention:  Clarification, Education and Support  Summary of Progress/Problems: Positive thinking and positive change were discussed.    Sheri Hill S 02/14/2020, 10:05 PM

## 2020-02-14 NOTE — Progress Notes (Signed)
   02/14/20 2040  Psych Admission Type (Psych Patients Only)  Admission Status Voluntary  Psychosocial Assessment  Patient Complaints None  Eye Contact Brief  Facial Expression Animated  Affect Anxious  Speech Soft  Interaction Childlike;Submissive  Motor Activity Other (Comment) (wnl)  Appearance/Hygiene Unremarkable  Behavior Characteristics Cooperative  Mood Pleasant;Silly  Thought Process  Coherency WDL  Content WDL  Delusions None reported or observed  Perception WDL  Hallucination None reported or observed  Judgment Poor  Confusion None  Danger to Self  Current suicidal ideation? Denies  Danger to Others  Danger to Others None reported or observed

## 2020-02-14 NOTE — Progress Notes (Signed)
Pt greeted RN with a smile.  Pt then denied SI/HI/AVH.  Pt denied depression but said she was anxious and rated her anxiety as a 5 out of 10.  Pt suddenly started to cry.  RN encouraged pt to express her emotions.  Pt didn't say anything, just continued to cry.  Pt has been interacting with peers and participating in groups. Q 15 min safety checks remain in place.  RN will continue to monitor and provide support as needed.
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
## 2020-02-14 NOTE — Telephone Encounter (Signed)
Patient seen in [facility] ED on 02/12/20, and was subsequently admitted to [facility] on 02/13/20. Will route to CM for completion of Transition of Care Assessment. Order placed for Community Continuation of Care.  Burnard Bunting, RN, BSN, CCRN Patient Engagement Center [phone]
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
## 2020-02-14 NOTE — Progress Notes (Signed)
Adult Psychoeducational Group Note  Date:  02/14/2020 Time:  12:05 PM  Group Topic/Focus:  Goals Group:   The focus of this group is to help patients establish daily goals to achieve during treatment and discuss how the patient can incorporate goal setting into their daily lives to aide in recovery.  Participation Level:  Active  Participation Quality:  Appropriate  Affect:  Appropriate  Cognitive:  Alert  Insight: Appropriate  Engagement in Group:  Engaged  Modes of Intervention:  Discussion  Additional Comments:  Pt attended group and participated in discussion.  Kinjal Neitzke R Thorne Wirz 02/14/2020, 12:05 PM

## 2020-02-15 ENCOUNTER — Telehealth: Payer: Self-pay | Admitting: General Practice

## 2020-02-15 DIAGNOSIS — T1491XA Suicide attempt, initial encounter: Secondary | ICD-10-CM | POA: Diagnosis not present

## 2020-02-15 NOTE — Tx Team (Signed)
Interdisciplinary Treatment and Diagnostic Plan Update  02/15/2020 Time of Session: 0925 Sheri Hill MRN: [id number]  Principal Diagnosis: <principal problem not specified>  Secondary Diagnoses: Active Problems:   Suicide attempt [facility])   Current Medications:  Current Facility-Administered Medications  Medication Dose Route Frequency Provider Last Rate Last Admin  . acetaminophen (TYLENOL) tablet 650 mg  650 mg Oral Q6H PRN Antonieta Pert, MD      . alum & mag hydroxide-simeth (MAALOX/MYLANTA) 200-200-20 MG/5ML suspension 30 mL  30 mL Oral Q4H PRN Antonieta Pert, MD      . busPIRone (BUSPAR) tablet 5 mg  5 mg Oral BID Antonieta Pert, MD   5 mg at 02/15/20 0755  . hydrOXYzine (ATARAX/VISTARIL) tablet 25 mg  25 mg Oral TID PRN Antonieta Pert, MD      . magnesium hydroxide (MILK OF MAGNESIA) suspension 30 mL  30 mL Oral Daily PRN Antonieta Pert, MD      . sertraline (ZOLOFT) tablet 25 mg  25 mg Oral Daily Antonieta Pert, MD   25 mg at 02/15/20 0755  . traZODone (DESYREL) tablet 50 mg  50 mg Oral QHS PRN Antonieta Pert, MD       PTA Medications: Medications Prior to Admission  Medication Sig Dispense Refill Last Dose  . Prenatal Vit-Fe Fumarate-FA (PRENATAL MULTIVITAMIN) TABS tablet Take 1 tablet by mouth daily at 12 noon.       Patient Stressors:    Patient Strengths:    Treatment Modalities: Medication Management, Group therapy, Case management,  1 to 1 session with clinician, Psychoeducation, Recreational therapy.   Physician Treatment Plan for Primary Diagnosis: <principal problem not specified> Long Term Goal(s): Improvement in symptoms so as ready for discharge Improvement in symptoms so as ready for discharge   Short Term Goals: Ability to identify changes in lifestyle to reduce recurrence of condition will improve Ability to verbalize feelings will improve Ability to disclose and discuss suicidal ideas Ability to demonstrate self-control will  improve Ability to identify and develop effective coping behaviors will improve Ability to maintain clinical measurements within normal limits will improve Compliance with prescribed medications will improve Ability to identify triggers associated with substance abuse/mental health issues will improve Ability to identify changes in lifestyle to reduce recurrence of condition will improve Ability to verbalize feelings will improve Ability to disclose and discuss suicidal ideas Ability to demonstrate self-control will improve Ability to identify and develop effective coping behaviors will improve Ability to maintain clinical measurements within normal limits will improve Compliance with prescribed medications will improve Ability to identify triggers associated with substance abuse/mental health issues will improve  Medication Management: Evaluate patient's response, side effects, and tolerance of medication regimen.  Therapeutic Interventions: 1 to 1 sessions, Unit Group sessions and Medication administration.  Evaluation of Outcomes: Progressing  Physician Treatment Plan for Secondary Diagnosis: Active Problems:   Suicide attempt [facility])  Long Term Goal(s): Improvement in symptoms so as ready for discharge Improvement in symptoms so as ready for discharge   Short Term Goals: Ability to identify changes in lifestyle to reduce recurrence of condition will improve Ability to verbalize feelings will improve Ability to disclose and discuss suicidal ideas Ability to demonstrate self-control will improve Ability to identify and develop effective coping behaviors will improve Ability to maintain clinical measurements within normal limits will improve Compliance with prescribed medications will improve Ability to identify triggers associated with substance abuse/mental health issues will improve Ability to identify changes in  lifestyle to reduce recurrence of condition will improve Ability to  verbalize feelings will improve Ability to disclose and discuss suicidal ideas Ability to demonstrate self-control will improve Ability to identify and develop effective coping behaviors will improve Ability to maintain clinical measurements within normal limits will improve Compliance with prescribed medications will improve Ability to identify triggers associated with substance abuse/mental health issues will improve     Medication Management: Evaluate patient's response, side effects, and tolerance of medication regimen.  Therapeutic Interventions: 1 to 1 sessions, Unit Group sessions and Medication administration.  Evaluation of Outcomes: Progressing   RN Treatment Plan for Primary Diagnosis: <principal problem not specified> Long Term Goal(s): Knowledge of disease and therapeutic regimen to maintain health will improve  Short Term Goals: Ability to remain free from injury will improve, Ability to disclose and discuss suicidal ideas, Ability to identify and develop effective coping behaviors will improve and Compliance with prescribed medications will improve  Medication Management: RN will administer medications as ordered by provider, will assess and evaluate patient's response and provide education to patient for prescribed medication. RN will report any adverse and/or side effects to prescribing provider.  Therapeutic Interventions: 1 on 1 counseling sessions, Psychoeducation, Medication administration, Evaluate responses to treatment, Monitor vital signs and CBGs as ordered, Perform/monitor CIWA, COWS, AIMS and Fall Risk screenings as ordered, Perform wound care treatments as ordered.  Evaluation of Outcomes: Progressing   LCSW Treatment Plan for Primary Diagnosis: <principal problem not specified> Long Term Goal(s): Safe transition to appropriate next level of care at discharge, Engage patient in therapeutic group addressing interpersonal concerns.  Short Term Goals: Engage  patient in aftercare planning with referrals and resources, Increase ability to appropriately verbalize feelings, Increase emotional regulation and Increase skills for wellness and recovery  Therapeutic Interventions: Assess for all discharge needs, 1 to 1 time with Social worker, Explore available resources and support systems, Assess for adequacy in community support network, Educate family and significant other(s) on suicide prevention, Complete Psychosocial Assessment, Interpersonal group therapy.  Evaluation of Outcomes: Progressing   Progress in Treatment: Attending groups: Yes. Participating in groups: Yes. Taking medication as prescribed: Yes. Toleration medication: Yes. Family/Significant other contact made: No, will contact:  husband. Patient understands diagnosis: Yes. Discussing patient identified problems/goals with staff: Yes. Medical problems stabilized or resolved: Yes. Denies suicidal/homicidal ideation: Yes. Issues/concerns per patient self-inventory: No. Other: N/A  New problem(s) identified: No, Describe:  None noted.  New Short Term/Long Term Goal(s):  Safe transition to appropriate next level of care at discharge, Engage patient in therapeutic group addressing interpersonal concerns.  Patient Goals:  "To get over anxiety and depression; Get family therapy"  Discharge Plan or Barriers:  Pt to return to family. Pt to follow up with recommended level of care and medication management services.  Reason for Continuation of Hospitalization: Anxiety Depression Medication stabilization Suicidal ideation  Estimated Length of Stay: 3-5 days Attendees: Patient: Sheri Hill 02/15/2020 12:19 PM  Physician: Dr. Jola Babinski, MD 02/15/2020 12:19 PM  Nursing:  02/15/2020 12:19 PM  RN Care Manager: 02/15/2020 12:19 PM  Social Worker: Cyril Loosen, LCSW 02/15/2020 12:19 PM  Recreational Therapist:  02/15/2020 12:19 PM  Other:  02/15/2020 12:19 PM  Other:  02/15/2020 12:19 PM  Other: 02/15/2020  12:19 PM    Scribe for Treatment Team: Leisa Lenz, LCSW 02/15/2020 12:19 PM

## 2020-02-15 NOTE — Progress Notes (Signed)
   02/15/20 0628  Vital Signs  Temp 98.6 F (37 C)  Temp Source Oral  Pulse Rate 75  Resp 16  BP 126/65  BP Location Left Arm  BP Method Automatic  Patient Position (if appropriate) Standing  Oxygen Therapy  SpO2 100 %   D: Patient denies SI/HI/ AVH. Patient denies anxiety and depression. Patient has been out on open areas and has been social with peers.   A:  Patient took scheduled medicine.  Support and encouragement provided Routine safety checks conducted every 15 minutes. Patient  Informed to notify staff with any concerns.   R:  Safety maintained.

## 2020-02-15 NOTE — Progress Notes (Signed)
Patient rated her day as a 9 out of 10 since she had a good talk with her child. Her goal for tomorrow is to work on getting one step closer to discharge.
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
## 2020-02-15 NOTE — Telephone Encounter (Signed)
°  Managed Medicaid Care Management   Outreach Note  02/15/2020 Name: KRISSIE MERRICK MRN: [id number] DOB: 16-Dec-1990  [name] is a 29 y.o. year old female who is a primary care patient of Patient, No Pcp Per. I reached out to [name] by phone today in response to a referral sent by Ms. Lanetta Inch health plan.     An unsuccessful telephone outreach was attempted today. The patient was referred to the case management team for assistance with care management and care coordination.   Follow Up Plan: The care management team will reach out to the patient again over the next 7 days.  If patient returns call to provider office, please advise to call Embedded Care Management Care Guide Milagros Evener* at [phone]*  Milagros Evener

## 2020-02-16 DIAGNOSIS — T1491XA Suicide attempt, initial encounter: Secondary | ICD-10-CM | POA: Diagnosis not present

## 2020-02-16 DIAGNOSIS — F331 Major depressive disorder, recurrent, moderate: Secondary | ICD-10-CM | POA: Diagnosis not present

## 2020-02-16 DIAGNOSIS — F329 Major depressive disorder, single episode, unspecified: Secondary | ICD-10-CM

## 2020-02-16 MED ORDER — BUSPIRONE HCL 5 MG PO TABS
5.0000 mg | ORAL_TABLET | Freq: Two times a day (BID) | ORAL | 0 refills | Status: DC
Start: 2020-02-16 — End: 2020-02-16

## 2020-02-16 MED ORDER — SERTRALINE HCL 25 MG PO TABS: 25.0000 mg | ORAL_TABLET | Freq: Every day | ORAL | 0 refills | Status: DC

## 2020-02-16 MED ORDER — SERTRALINE HCL 25 MG PO TABS
25.0000 mg | ORAL_TABLET | Freq: Every day | ORAL | 0 refills | Status: DC
Start: 2020-02-17 — End: 2022-02-22

## 2020-02-16 MED ORDER — BUSPIRONE HCL 5 MG PO TABS
5.0000 mg | ORAL_TABLET | Freq: Two times a day (BID) | ORAL | 0 refills | Status: AC
Start: 2020-02-16 — End: 2020-03-17

## 2020-02-16 NOTE — Progress Notes (Signed)
  [facility] Adult Case Management Discharge Plan :  Will you be returning to the same living situation after discharge:  Yes,  to home At discharge, do you have transportation home?: Yes,  husband to pick this patient up Do you have the ability to pay for your medications: Yes,  has Medicaid  Release of information consent forms completed and in the chart;  Patient's signature needed at discharge.  Patient to Follow up at:  Follow-up Information    Services, [facility]. Go to.   Why: An appointment has been scheduled for Tuesday 02/20/20 at 9am. Contact information: [address] [place] [address] [phone]               Next level of care provider has access to [facility] Link:no  Safety Planning and Suicide Prevention discussed: Yes,  with husband     Has patient been referred to the Quitline?: N/A patient is not a smoker  Patient has been referred for addiction treatment: N/A  Otelia Santee, LCSW 02/16/2020, 12:08 PM
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
## 2020-02-16 NOTE — Plan of Care (Signed)
Patient discharged to home

## 2020-02-16 NOTE — Progress Notes (Signed)
   02/16/20 0610  Psych Admission Type (Psych Patients Only)  Admission Status Voluntary  Psychosocial Assessment  Patient Complaints None  Eye Contact Brief  Facial Expression Animated  Affect Appropriate to circumstance  Speech Soft  Interaction Childlike;Submissive  Motor Activity Other (Comment) (wnl)  Appearance/Hygiene Unremarkable  Behavior Characteristics Cooperative  Mood Pleasant  Thought Process  Coherency WDL  Content WDL  Delusions None reported or observed  Perception WDL  Hallucination None reported or observed  Judgment Poor  Confusion None  Danger to Self  Current suicidal ideation? Denies  Danger to Others  Danger to Others None reported or observed

## 2020-02-16 NOTE — Progress Notes (Signed)
Recreation Therapy Notes  Date: 9.3.21 Time: 0930 Location: [address] Dayroom  Group Topic: Stress Management  Goal Area(s) Addresses:  Patient will identify positive stress management techniques. Patient will identify benefits of using stress management post d/c.  Behavioral Response: Engaged  Intervention: Stress Management  Activity:  Guided Imagery.  LRT read a script that focused on relaxing in a peaceful meadow.  Patients were to listen and follow along as script was read to fully engage.    Education:  Stress Management, Discharge Planning.   Education Outcome: Acknowledges Education  Clinical Observations/Feedback: Pt attended and participated in activity.    Caroll Rancher, LRT/CTRS         Caroll Rancher A 02/16/2020 11:42 AM

## 2020-02-16 NOTE — Progress Notes (Signed)
Patient ID: [name], female   DOB: [birth year], 29 y.o.   MRN: [id number] Patient discharged to home/self care in the presence of her husband.  Patient was eager to discharge and stated she was glad to get home to her children.  Patient was noted to be smiling and excited to see her family.  Patient acknowledged all understanding of discharge instructions and receipt of personal belongings.

## 2020-02-16 NOTE — [facility] Suicide Risk Assessment (Signed)
[facility] Discharge Suicide Risk Assessment   Principal Problem: Major depression Discharge Diagnoses: Principal Problem:   Major depression Active Problems:   Suicide attempt (HCC)   Total Time spent with patient: 35 min  Musculoskeletal: Strength & Muscle Tone: within normal limits Gait & Station: normal Patient leans: N/A  Psychiatric Specialty Exam: Review of Systems  Constitutional: Negative.   HENT: Negative.   Respiratory: Negative.   Cardiovascular: Negative.   Gastrointestinal: Negative.   Musculoskeletal: Negative.   Neurological: Negative.   Psychiatric/Behavioral: Negative for agitation, behavioral problems, confusion, decreased concentration, dysphoric mood, hallucinations, self-injury, sleep disturbance and suicidal ideas. The patient is not nervous/anxious and is not hyperactive.     Blood pressure 136/73, pulse 94, temperature 98.5 F (36.9 C), temperature source Oral, resp. rate 16, height 5\' 4"  (1.626 m), weight 41.7 kg, last menstrual period 01/24/2020, SpO2 100 %, unknown if currently breastfeeding.Body mass index is 15.79 kg/m.  General Appearance: Casual and Neat  Eye Contact::  Good  Speech:  Normal Rate409  Volume:  Normal  Mood:  Euthymic  Affect:  Appropriate  Thought Process:  Goal Directed and Descriptions of Associations: Intact  Orientation:  Full (Time, Place, and Person)  Thought Content:  Logical and Hallucinations: None  Suicidal Thoughts:  No  Homicidal Thoughts:  No  Memory:  Immediate;   Good Recent;   Good Remote;   Good  Judgement:  Fair  Insight:  Fair  Psychomotor Activity:  Normal  Concentration:  Good  Recall:  Good  Fund of Knowledge:Good  Language: Good  Akathisia:  No  Handed:  Right  AIMS (if indicated):     Assets:  Communication Skills Desire for Improvement Financial Resources/Insurance Housing Physical Health Resilience Social Support Transportation  Sleep:  Number of Hours: 6.75  Cognition: Impaired,  Mild  learning disability  ADL's:  Intact   Mental Status Per Nursing Assessment::   On Admission:  NA  Demographic Factors:  Caucasian  Loss Factors: NA  Historical Factors: Prior suicide attempts and Family history of mental illness or substance abuse  Risk Reduction Factors:   Responsible for children under 15 years of age, Sense of responsibility to family, Living with another person, especially a relative, Positive social support and Positive therapeutic relationship  Continued Clinical Symptoms:  Depression:   Impulsivity  Cognitive Features That Contribute To Risk:  None    Suicide Risk:  Mild:  Suicidal ideation of limited frequency, intensity, duration, and specificity.  There are no identifiable plans, no associated intent, mild dysphoria and related symptoms, good self-control (both objective and subjective assessment), few other risk factors, and identifiable protective factors, including available and accessible social support.   Follow-up Information    Services, [facility]. Go to.   Why: An appointment has been scheduled for Tuesday 02/20/20 at 9am. Contact information: [address] [place] [phone]               Plan Of Care/Follow-up recommendations:  Activity:  ad lib Diet:  as tolerated Other:  instructions as per AVS.   On day of discharge following sustained improvement in the affect of this patient, continued report of euthymic mood, repeated denial of suicidal, homicidal, and other violent ideation, adequate interaction with peers, active participation in groups while on the unit, and denial of adverse reactions from medications, the treatment team decided [phone] was stable for discharge home. She was able to engage in safety planning including plan to return to nearest emergency  room or contact emergency services if she feels unable to maintain her own safety or the safety of others. Patient had no further questions,  comments, or concerns.  Discharge into care of Husband, who agrees to maintain patient safety.    Mariel Craft, MD 02/16/2020, 12:51 PM

## 2020-02-16 NOTE — [facility] Suicide Risk Assessment (Signed)
[facility] INPATIENT:  Family/Significant Other Suicide Prevention Education  Suicide Prevention Education: Education Completed; Husband, Cathren Sween [phone]),  has been identified by the patient as the family member/significant other with whom the patient will be residing, and identified as the person(s) who will aid the patient in the event of a mental health crisis (suicidal ideations/suicide attempt).  With written consent from the patient, the family member/significant other has been provided the following suicide prevention education, prior to the and/or following the discharge of the patient.  CSW spoke with this patients husband who stated he had no safety concerns with this patient discharging and returning home. He stated that their children had been staying with his mother for a few days, but he was currently taking care of at this point on his own.    Ruthann Cancer MSW, LCSW Clincal Social Worker  [facility]

## 2020-04-30 ENCOUNTER — Other Ambulatory Visit: Payer: Self-pay

## 2020-04-30 ENCOUNTER — Emergency Department (HOSPITAL_COMMUNITY)
Admission: EM | Admit: 2020-04-30 | Discharge: 2020-04-30 | Disposition: A | Discharge status: Home / Self Care | Payer: Medicaid Other

## 2020-04-30 ENCOUNTER — Inpatient Hospital Stay (HOSPITAL_COMMUNITY)
Admission: EM | Admit: 2020-04-30 | Discharge: 2020-05-01 | Disposition: A | Discharge status: Home / Self Care | Payer: Medicaid Other | Attending: Obstetrics and Gynecology | Admitting: Obstetrics and Gynecology

## 2020-04-30 ENCOUNTER — Encounter (HOSPITAL_COMMUNITY): Payer: Self-pay | Admitting: Emergency Medicine

## 2020-04-30 DIAGNOSIS — Z3A10 10 weeks gestation of pregnancy: Secondary | ICD-10-CM | POA: Diagnosis not present

## 2020-04-30 DIAGNOSIS — Z79899 Other long term (current) drug therapy: Secondary | ICD-10-CM | POA: Insufficient documentation

## 2020-04-30 DIAGNOSIS — O039 Complete or unspecified spontaneous abortion without complication: Secondary | ICD-10-CM | POA: Diagnosis not present

## 2020-04-30 DIAGNOSIS — O209 Hemorrhage in early pregnancy, unspecified: Secondary | ICD-10-CM | POA: Diagnosis not present

## 2020-04-30 DIAGNOSIS — O021 Missed abortion: Secondary | ICD-10-CM | POA: Diagnosis present

## 2020-04-30 DIAGNOSIS — N939 Abnormal uterine and vaginal bleeding, unspecified: Secondary | ICD-10-CM | POA: Diagnosis not present

## 2020-04-30 DIAGNOSIS — O4692 Antepartum hemorrhage, unspecified, second trimester: Secondary | ICD-10-CM

## 2020-04-30 DIAGNOSIS — Z3490 Encounter for supervision of normal pregnancy, unspecified, unspecified trimester: Secondary | ICD-10-CM

## 2020-04-30 LAB — I-STAT BETA HCG BLOOD, ED (MC, WL, AP ONLY): I-stat hCG, quantitative: >2000 m[IU]/mL — ABNORMAL HIGH (ref <5)

## 2020-04-30 NOTE — ED Triage Notes (Signed)
Patient reports vaginal bleeding this evening , she is 4 months pregnant G4P3 , denies abdominal cramping or contractions , no fever or chills .

## 2020-04-30 NOTE — ED Triage Notes (Signed)
PA to evaluate patient at triage , reports given to MAU RN .

## 2020-04-30 NOTE — MAU Note (Signed)
Pt reprots she is 4 months pregnant and she started having vaginal bleeding. Was pink and then turned brown. Denies any pain or cramping. No recent intercourse.

## 2020-04-30 NOTE — ED Provider Notes (Signed)
10:20 PM presents the emergency department for vaginal bleeding in setting of early pregnancy.  Patient states that she found out she was pregnant in September of this year.  She reports having vaginal bleeding, dark brown, like the start of a menstrual period earlier today.  No pelvic pain.  No fevers, nausea, vomiting, or diarrhea.  BP (!) 129/52 (BP Location: Left Arm)    Pulse 93    Temp 99 F (37.2 C) (Oral)    Resp 16    Ht 5\' 4"  (1.626 m)    Wt 50 kg    LMP 01/24/2020    SpO2 100%    BMI 18.92 kg/m   Patient is stable to be seen in MAU.  Discussed with provider who accepts patient.   03/25/2020, PA-C 04/30/20 2222    05/02/20, MD 04/30/20 2228

## 2020-05-01 ENCOUNTER — Inpatient Hospital Stay (EMERGENCY_DEPARTMENT_HOSPITAL)
Admission: AD | Admit: 2020-05-01 | Discharge: 2020-05-02 | Disposition: A | Discharge status: Home / Self Care | Payer: Medicaid Other | Source: Home / Self Care | Attending: Obstetrics and Gynecology | Admitting: Obstetrics and Gynecology

## 2020-05-01 ENCOUNTER — Inpatient Hospital Stay (HOSPITAL_COMMUNITY): Payer: Medicaid Other

## 2020-05-01 ENCOUNTER — Inpatient Hospital Stay (HOSPITAL_BASED_OUTPATIENT_CLINIC_OR_DEPARTMENT_OTHER): Payer: Medicaid Other

## 2020-05-01 ENCOUNTER — Encounter (HOSPITAL_COMMUNITY): Payer: Self-pay | Admitting: Obstetrics and Gynecology

## 2020-05-01 DIAGNOSIS — Z3A10 10 weeks gestation of pregnancy: Secondary | ICD-10-CM

## 2020-05-01 DIAGNOSIS — O209 Hemorrhage in early pregnancy, unspecified: Secondary | ICD-10-CM | POA: Diagnosis not present

## 2020-05-01 DIAGNOSIS — O021 Missed abortion: Secondary | ICD-10-CM | POA: Diagnosis present

## 2020-05-01 DIAGNOSIS — O468X1 Other antepartum hemorrhage, first trimester: Secondary | ICD-10-CM | POA: Diagnosis not present

## 2020-05-01 DIAGNOSIS — R42 Dizziness and giddiness: Secondary | ICD-10-CM | POA: Diagnosis not present

## 2020-05-01 DIAGNOSIS — R58 Hemorrhage, not elsewhere classified: Secondary | ICD-10-CM | POA: Diagnosis not present

## 2020-05-01 DIAGNOSIS — O200 Threatened abortion: Secondary | ICD-10-CM | POA: Diagnosis not present

## 2020-05-01 DIAGNOSIS — N939 Abnormal uterine and vaginal bleeding, unspecified: Secondary | ICD-10-CM | POA: Diagnosis not present

## 2020-05-01 DIAGNOSIS — O039 Complete or unspecified spontaneous abortion without complication: Secondary | ICD-10-CM | POA: Insufficient documentation

## 2020-05-01 DIAGNOSIS — O034 Incomplete spontaneous abortion without complication: Secondary | ICD-10-CM

## 2020-05-01 LAB — CBC
HCT: 26.8 % — ABNORMAL LOW (ref 36.0–46.0)
Hemoglobin: 8.9 g/dL — ABNORMAL LOW (ref 12.0–15.0)
MCH: 28.9 pg (ref 26.0–34.0)
MCHC: 33.2 g/dL (ref 30.0–36.0)
MCV: 87 fL (ref 80.0–100.0)
Platelets: 206 10*3/uL (ref 150–400)
RBC: 3.08 MIL/uL — ABNORMAL LOW (ref 3.87–5.11)
RDW: 13.4 % (ref 11.5–15.5)
WBC: 15 10*3/uL — ABNORMAL HIGH (ref 4.0–10.5)
nRBC: 0 % (ref 0.0–0.2)

## 2020-05-01 LAB — TYPE AND SCREEN
ABO/RH(D): O POS
Antibody Screen: NEGATIVE

## 2020-05-01 MED ORDER — MISOPROSTOL 200 MCG PO TABS
400.0000 ug | ORAL_TABLET | Freq: Once | ORAL | Status: AC
  Administered 2020-05-01: 400 ug via BUCCAL
  Filled 2020-05-01: qty 2

## 2020-05-01 MED ORDER — LACTATED RINGERS IV SOLN: Freq: Once | INTRAVENOUS | Status: AC

## 2020-05-01 MED ORDER — TRAMADOL HCL 50 MG PO TABS
50.0000 mg | ORAL_TABLET | Freq: Four times a day (QID) | ORAL | 0 refills | Status: DC | PRN
Start: 2020-05-01 — End: 2020-05-06

## 2020-05-01 MED ORDER — HYDROMORPHONE HCL 1 MG/ML IJ SOLN
1.0000 mg | Freq: Once | INTRAMUSCULAR | Status: DC
  Filled 2020-05-01: qty 1

## 2020-05-01 MED ORDER — SODIUM CHLORIDE 0.9 % IV SOLN: Freq: Once | INTRAVENOUS | Status: AC

## 2020-05-01 MED ORDER — MISOPROSTOL 200 MCG PO TABS: ORAL_TABLET | ORAL | 1 refills | Status: DC

## 2020-05-01 MED ORDER — PROMETHAZINE HCL 25 MG PO TABS: 25.0000 mg | ORAL_TABLET | Freq: Four times a day (QID) | ORAL | 0 refills | Status: DC | PRN

## 2020-05-01 MED ORDER — HYDROMORPHONE HCL 1 MG/ML IJ SOLN
1.0000 mg | Freq: Once | INTRAMUSCULAR | Status: AC
  Administered 2020-05-01: 1 mg via INTRAVENOUS

## 2020-05-01 NOTE — MAU Note (Signed)
PT ambulated to w/c without assistance. Sat there about 30secs and color got palor and pt passed out in chair. Ammonia under nose and pt came around immediately. Helped back into bed and was alert and oriented. Will do u/s bedside. Rolitta Arita Miss CNM and Wynelle Bourgeois CNM at bedside

## 2020-05-01 NOTE — MAU Provider Note (Signed)
Chief Complaint: Vaginal Bleeding : 4 Months Pregnant and Vaginal Bleeding  Provider saw patient at 0230    SUBJECTIVE HPI: Sheri Hill is a 29 y.o. [phone] at [redacted]w[redacted]d by LMP who presents to maternity admissions reporting dark reddish brown bleeding.  No cramping.  Had a positive pregnancy test in September.  She denies urinary symptoms, h/a, dizziness, n/v, or fever/chills.    States has had 3 normal pregnancies, no history of miscarriage or problems. Does not have a prenatal care provider but went to [facility] in past.   Vaginal Bleeding The patient's primary symptoms include vaginal bleeding. The patient's pertinent negatives include no genital itching, genital lesions, genital odor or pelvic pain. This is a new problem. The current episode started today. The problem occurs intermittently. The problem has been unchanged. The patient is experiencing no pain. Pertinent negatives include no abdominal pain, back pain, chills, constipation, diarrhea, dysuria, fever, nausea or vomiting. The vaginal discharge was brown. The vaginal bleeding is lighter than menses. She has not been passing clots. She has not been passing tissue. Nothing aggravates the symptoms. She has tried nothing for the symptoms. It is unknown whether or not her partner has an STD. She uses nothing for contraception.   ED Note: presents the emergency department for vaginal bleeding in setting of early pregnancy.  Patient states that she found out she was pregnant in September of this year.  She reports having vaginal bleeding, dark brown, like the start of a menstrual period earlier today.  No pelvic pain.  No fevers, nausea, vomiting, or diarrhea.  Past Medical History:  Diagnosis Date  . Medical history non-contributory    Past Surgical History:  Procedure Laterality Date  . NO PAST SURGERIES     Social History   Socioeconomic History  . Marital status: Married    Spouse name: Not on file  . Number of children: Not on  file  . Years of education: Not on file  . Highest education level: Not on file  Occupational History  . Not on file  Tobacco Use  . Smoking status: Never Smoker  . Smokeless tobacco: Never Used  Vaping Use  . Vaping Use: Never used  Substance and Sexual Activity  . Alcohol use: No  . Drug use: No  . Sexual activity: Yes    Birth control/protection: None  Other Topics Concern  . Not on file  Social History Narrative  . Not on file   Social Determinants of Health   Financial Resource Strain:   . Difficulty of Paying Living Expenses: Not on file  Food Insecurity:   . Worried About Programme researcher, broadcasting/film/video in the Last Year: Not on file  . Ran Out of Food in the Last Year: Not on file  Transportation Needs:   . Lack of Transportation (Medical): Not on file  . Lack of Transportation (Non-Medical): Not on file  Physical Activity:   . Days of Exercise per Week: Not on file  . Minutes of Exercise per Session: Not on file  Stress:   . Feeling of Stress : Not on file  Social Connections:   . Frequency of Communication with Friends and Family: Not on file  . Frequency of Social Gatherings with Friends and Family: Not on file  . Attends Religious Services: Not on file  . Active Member of Clubs or Organizations: Not on file  . Attends Banker Meetings: Not on file  . Marital Status: Not on file  Intimate Partner Violence:   . Fear of Current or Ex-Partner: Not on file  . Emotionally Abused: Not on file  . Physically Abused: Not on file  . Sexually Abused: Not on file   No current facility-administered medications on file prior to encounter.   Current Outpatient Medications on File Prior to Encounter  Medication Sig Dispense Refill  . Prenatal Vit-Fe Fumarate-FA (PRENATAL MULTIVITAMIN) TABS tablet Take 1 tablet by mouth daily at 12 noon.    . sertraline (ZOLOFT) 25 MG tablet Take 1 tablet (25 mg total) by mouth daily. 30 tablet 0   No Known Allergies  I have  reviewed patient's Past Medical Hx, Surgical Hx, Family Hx, Social Hx, medications and allergies.   ROS:  Review of Systems  Constitutional: Negative for chills and fever.  Gastrointestinal: Negative for abdominal pain, constipation, diarrhea, nausea and vomiting.  Genitourinary: Positive for vaginal bleeding. Negative for dysuria and pelvic pain.  Musculoskeletal: Negative for back pain.   Review of Systems  Other systems negative   Physical Exam  Physical Exam Patient Vitals for the past 24 hrs:  BP Temp Temp src Pulse Resp SpO2 Height Weight  04/30/20 2253 125/67 -- -- 84 18 -- 5\' 4"  (1.626 m) 46.7 kg  04/30/20 2149 -- -- -- -- -- -- 5\' 4"  (1.626 m) 50 kg  04/30/20 2134 (!) 129/52 99 F (37.2 C) Oral 93 16 100 % -- --   Constitutional: Well-developed, well-nourished female in no acute distress.  Cardiovascular: normal rate Respiratory: normal effort GI: Abd soft, non-tender. Pos BS x 4 MS: Extremities nontender, no edema, normal ROM Neurologic: Alert and oriented x 4.  GU: Neg CVAT.  PELVIC EXAM: deferred since I was ordering an 05/02/20 and she was having no pain  LAB RESULTS Results for orders placed or performed during the hospital encounter of 04/30/20 (from the past 24 hour(s))  I-Stat Beta hCG blood, ED ([facility], [facility], [facility] only)     Status: Abnormal   Collection Time: 04/30/20 10:01 PM  Result Value Ref Range   I-stat hCG, quantitative >2,000.0 (H) <5 mIU/mL   Comment 3           Blood Type is O+  IMAGING 05/02/20 OB Comp Less 14 Wks  Result Date: 05/01/2020 CLINICAL DATA:  Initial evaluation for acute vaginal bleeding, cramping. Early pregnancy. EXAM: OBSTETRIC <14 WK ULTRASOUND TECHNIQUE: Transabdominal ultrasound was performed for evaluation of the gestation as well as the maternal uterus and adnexal regions. COMPARISON:  None. FINDINGS: Intrauterine gestational sac: Single Yolk sac:  Negative. Embryo:  Present. Cardiac Activity: Negative. Heart Rate: N/A CRL: 33.7 mm   10 w 1 d                   [place] EDC: 11/26/2020 Subchorionic hemorrhage:  None visualized. Maternal uterus/adnexae: Right ovary within normal limits. Left ovary not visualized. No adnexal mass or free fluid. IMPRESSION: 1. Single intrauterine pregnancy with internal embryo, crown-rump length measuring 33.7 mm, with no detectable cardiac activity. Findings meet definitive criteria for failed pregnancy. This follows SRU consensus guidelines: Diagnostic Criteria for Nonviable Pregnancy Early in the First Trimester. [place] J Med [phone]. 2. No other acute maternal uterine or adnexal abnormality. Electronically Signed   By: Macy Mis M.D.   On: 05/01/2020 02:47    MAU Management/MDM: Ordered baseline Ultrasound to rule out ectopic or SAB.  Rise Mu did show a missed abortion with a 10 wk sized fetus with no cardiac activity Discussed with  patient who is tearful She tried to call her husband but states "he wouldn't pick up"  This bleeding can represent a normal pregnancy with bleeding, spontaneous abortion or even an ectopic which can be life-threatening.  The process as listed above helps to determine which of these is present.  Reviewed options including expectant management, Cytotec induction and Surgical D&E.  She is undecided about the first two options.  Wanted to discuss with her husband but could not reach him  Discussed I can prescribe the meds (and explained how to use them) and she can choose to use them or not.    ASSESSMENT 1. Vaginal bleeding in pregnancy, second trimester   2. Pregnancy with uncertain dates, antepartum   3.     Missed abortion, 10 week size  PLAN Discharge home Rx Cytotec prn  Rx Phenergan for nausea Rx Tramadol for pain  Will message [facility] to followup with her this week.   Pt stable at time of discharge. Encouraged to return here or to other Urgent Care/ED if she develops worsening of symptoms, increase in pain, fever, or other concerning  symptoms.    Wynelle Bourgeois CNM, MSN Certified Nurse-Midwife 05/01/2020  2:59 AM

## 2020-05-01 NOTE — MAU Provider Note (Signed)
Chief Complaint: Miscarriage  Patient seen at 2010      SUBJECTIVE HPI: Sheri Hill is a 29 y.o. [phone] at [redacted]w[redacted]d by LMP who presents to maternity admissions reporting passing her fetus at home at about 7pm.  Had had cramping all day.  Had continued heavy bleeding so came in.  Diagnosed with 10 wk Missed AB last night.  We had Rxed Cytotec meds but she never took them. She denies vaginal itching/burning, urinary symptoms, h/a, dizziness, n/v, or fever/chills.   Vaginal Bleeding The patient's primary symptoms include pelvic pain and vaginal bleeding. The patient's pertinent negatives include no genital itching, genital lesions or genital odor. The current episode started today. The pain is moderate. She is not pregnant. Associated symptoms include abdominal pain. Pertinent negatives include no constipation, diarrhea, dysuria, fever, nausea or vomiting. The vaginal discharge was bloody. The vaginal bleeding is heavier than menses. She has been passing clots. She has been passing tissue.   RN Note: Pt arrived by EMS to RM 121 by stretcher. Pt alert and oriented. STates she was [redacted]wks pregnant but passed baby at home about 1900. Reports soaking pads with vag bleeding since then. Arrived to MAU with large pad that was saturated with blood. Helped clean up and new pad on and helped to bed. Reports 10 pain on abd cramping  Past Medical History:  Diagnosis Date  . Medical history non-contributory    Past Surgical History:  Procedure Laterality Date  . NO PAST SURGERIES     Social History   Socioeconomic History  . Marital status: Married    Spouse name: Not on file  . Number of children: Not on file  . Years of education: Not on file  . Highest education level: Not on file  Occupational History  . Not on file  Tobacco Use  . Smoking status: Never Smoker  . Smokeless tobacco: Never Used  Vaping Use  . Vaping Use: Never used  Substance and Sexual Activity  . Alcohol use: No  . Drug use:  No  . Sexual activity: Yes    Birth control/protection: None  Other Topics Concern  . Not on file  Social History Narrative  . Not on file   Social Determinants of Health   Financial Resource Strain:   . Difficulty of Paying Living Expenses: Not on file  Food Insecurity:   . Worried About Programme researcher, broadcasting/film/video in the Last Year: Not on file  . Ran Out of Food in the Last Year: Not on file  Transportation Needs:   . Lack of Transportation (Medical): Not on file  . Lack of Transportation (Non-Medical): Not on file  Physical Activity:   . Days of Exercise per Week: Not on file  . Minutes of Exercise per Session: Not on file  Stress:   . Feeling of Stress : Not on file  Social Connections:   . Frequency of Communication with Friends and Family: Not on file  . Frequency of Social Gatherings with Friends and Family: Not on file  . Attends Religious Services: Not on file  . Active Member of Clubs or Organizations: Not on file  . Attends Banker Meetings: Not on file  . Marital Status: Not on file  Intimate Partner Violence:   . Fear of Current or Ex-Partner: Not on file  . Emotionally Abused: Not on file  . Physically Abused: Not on file  . Sexually Abused: Not on file   No current facility-administered medications on  file prior to encounter.   Current Outpatient Medications on File Prior to Encounter  Medication Sig Dispense Refill  . ibuprofen (ADVIL) 200 MG tablet Take 200 mg by mouth every 6 (six) hours as needed. Took 4 tablets this am and none since    . Prenatal Vit-Fe Fumarate-FA (PRENATAL MULTIVITAMIN) TABS tablet Take 1 tablet by mouth daily at 12 noon.    . misoprostol (CYTOTEC) 200 MCG tablet Place four tablets in between your gums and cheeks (two tablets on each side) as instructed 4 tablet 1  . promethazine (PHENERGAN) 25 MG tablet Take 1 tablet (25 mg total) by mouth every 6 (six) hours as needed for nausea or vomiting. 15 tablet 0  . sertraline (ZOLOFT)  25 MG tablet Take 1 tablet (25 mg total) by mouth daily. 30 tablet 0  . traMADol (ULTRAM) 50 MG tablet Take 1 tablet (50 mg total) by mouth every 6 (six) hours as needed. 10 tablet 0   No Known Allergies  I have reviewed patient's Past Medical Hx, Surgical Hx, Family Hx, Social Hx, medications and allergies.   ROS:  Review of Systems  Constitutional: Negative for fever.  Gastrointestinal: Positive for abdominal pain. Negative for constipation, diarrhea, nausea and vomiting.  Genitourinary: Positive for pelvic pain and vaginal bleeding. Negative for dysuria.   Review of Systems  Other systems negative   Physical Exam  Physical Exam Patient Vitals for the past 24 hrs:  BP Temp Pulse Resp SpO2 Height Weight  05/01/20 2011 127/70 98.4 F (36.9 C) (!) 102 18 100 % 5\' 4"  (1.626 m) 46.7 kg   Constitutional: Well-developed, well-nourished female in no acute distress.  Cardiovascular: normal rate Respiratory: normal effort GI: Abd soft, non-tender. MS: Extremities nontender, no edema, normal ROM Neurologic: Alert and oriented x 4.  GU: Neg CVAT.  PELVIC EXAM: clotted blood at introitus, Speculum inserted, clots and placental tissue removed with ring forceps.  Manual exam: cervix open 2+cm, clot and tissue removed from uterus manually but I still felt clot that I couldn't reach.     LAB RESULTS Results for orders placed or performed during the hospital encounter of 05/01/20 (from the past 24 hour(s))  CBC     Status: Abnormal   Collection Time: 05/01/20  9:58 PM  Result Value Ref Range   WBC 15.0 (H) 4.0 - 10.5 K/uL   RBC 3.08 (L) 3.87 - 5.11 MIL/uL   Hemoglobin 8.9 (L) 12.0 - 15.0 g/dL   HCT 05/03/20 (L) 36 - 46 %   MCV 87.0 80.0 - 100.0 fL   MCH 28.9 26.0 - 34.0 pg   MCHC 33.2 30.0 - 36.0 g/dL   RDW 33.8 25.0 - 53.9 %   Platelets 206 150 - 400 K/uL   nRBC 0.0 0.0 - 0.2 %  Type and screen     Status: None   Collection Time: 05/01/20  9:58 PM  Result Value Ref Range   ABO/RH(D)  O POS    Antibody Screen NEG    Sample Expiration      05/04/2020,2359 Performed at [facility], [address], [place], [place]     IMAGING [address] [place] Comp Less 14 Wks  Result Date: 05/01/2020 CLINICAL DATA:  29 year old female with vaginal bleeding in the 1st trimester of pregnancy, and definitive findings of failed pregnancy on ultrasound earlier today. Query retained products of conception. EXAM: OBSTETRIC <14 WK ULTRASOUND TECHNIQUE: Transabdominal ultrasound was performed for evaluation of the gestation as well as  the maternal uterus and adnexal regions. COMPARISON:  Ultrasound 0217 hours today. FINDINGS: Intrauterine gestational sac: None. Maternal uterus/adnexae: Uterine endometrial thickening up to 18 mm (image 16) and mildly heterogeneous (image 24), but with color Doppler interrogation no hypervascular areas are identified (images 17 and 18). No pelvic free fluid. Neither ovary is identified on these images. IMPRESSION: Absent IUP now with thickened endometrium but no definite retained products of conception. Electronically Signed   By: Odessa Fleming M.D.   On: 05/01/2020 21:45   US OB Comp Less 14 Wks  Result Date: 05/01/2020 CLINICAL DATA:  Initial evaluation for acute vaginal bleeding, cramping. Early pregnancy. EXAM: OBSTETRIC <14 WK ULTRASOUND TECHNIQUE: Transabdominal ultrasound was performed for evaluation of the gestation as well as the maternal uterus and adnexal regions. COMPARISON:  None. FINDINGS: Intrauterine gestational sac: Single Yolk sac:  Negative. Embryo:  Present. Cardiac Activity: Negative. Heart Rate: N/A CRL: 33.7 mm   10 w 1 d                  [place] EDC: 11/26/2020 Subchorionic hemorrhage:  None visualized. Maternal uterus/adnexae: Right ovary within normal limits. Left ovary not visualized. No adnexal mass or free fluid. IMPRESSION: 1. Single intrauterine pregnancy with internal embryo, crown-rump length measuring 33.7 mm, with no detectable cardiac activity.  Findings meet definitive criteria for failed pregnancy. This follows SRU consensus guidelines: Diagnostic Criteria for Nonviable Pregnancy Early in the First Trimester. Macy Mis J Med [phone]. 2. No other acute maternal uterine or adnexal abnormality. Electronically Signed   By: Rise Mu M.D.   On: 05/01/2020 02:47     MAU Management/MDM: Tissue and clot removed as described above.  Tolerated that well   [place] ordered to evaluate for retained POC, no evidence found Patient became pale and vomited, likely due to orthostasis, so IV fluid boluses were given x 2 liters   It took a while for her to recover to where she could stand up safely She was feeling well once these were completed.   ASSESSMENT 1. Missed abortion   2. Incomplete abortion   3.     Complete SAB by end of visit today  PLAN Discharge home Rx Zofran for nausea at home Bleeding precautions Message sent to [facility] for followup appt  Pt stable at time of discharge. Encouraged to return here or to other Urgent Care/ED if she develops worsening of symptoms, increase in pain, fever, or other concerning symptoms.    Wynelle Bourgeois CNM, MSN Certified Nurse-Midwife 05/01/2020  8:49 PM

## 2020-05-01 NOTE — MAU Note (Signed)
NT went in to do orthostatic VS and pt vomited. Artelia Laroche CNM aware. Will hang a second bag of IV flds. When RN checked on pt afterward pt denied anymore nausea. Made aware will hang an additional bag of flds and agrees with POC

## 2020-05-01 NOTE — MAU Note (Signed)
Pt arrived by EMS to [facility] 121 by stretcher. Pt alert and oriented. STates she was [redacted]wks pregnant but passed baby at home about 1900. Reports soaking pads with vag bleeding since then. Arrived to MAU with large pad that was saturated with blood. Helped clean up and new pad on and helped to bed. Reports 10 pain on abd cramping

## 2020-05-01 NOTE — Discharge Instructions (Signed)
Incomplete Miscarriage A miscarriage is the loss of an unborn baby (fetus) before the 20th week of pregnancy. In an incomplete miscarriage, parts of the fetus or placenta (afterbirth) remain in the body. Most miscarriages happen in the first 3 months of pregnancy. Sometimes, it happens before a woman even knows she is pregnant. Having a miscarriage can be an emotional experience. If you have had a miscarriage, talk with your health care provider about any questions you may have about miscarrying, the grieving process, and your future pregnancy plans. What are the causes? This condition may be caused by:  Problems with the genes or chromosomes that make it impossible for the baby to develop normally. These problems are most often the result of random errors that occur early in development, and are not passed from parent to child (not inherited).  Infection of the cervix or uterus.  Conditions that affect hormone balance in the body.  Problems with the cervix, such as the cervix opening and thinning before pregnancy is at term (cervical insufficiency).  Problems with the uterus, such as a uterus with an abnormal shape, fibroids in the uterus, or problems that were present from birth (congenital abnormalities).  Certain medical conditions.  Smoking, drinking alcohol, or using drugs.  Injury (trauma). Many times, the cause of a miscarriage is not known. What are the signs or symptoms? Symptoms of this condition include:  Vaginal bleeding or spotting, with or without cramps or pain.  Pain or cramping in the abdomen or lower back.  Passing fluid, tissue, or blood clots from the vagina. How is this diagnosed? This condition may be diagnosed based on:  A physical exam.  Ultrasound.  Blood tests.  Urine tests. How is this treated? An incomplete miscarriage may be treated with:  Dilation and curettage (D&C). This is a procedure in which the cervix is stretched open and the lining of  the uterus (endometrium) is scraped to remove any remaining tissue from the pregnancy.  Medicines, such as: ? Antibiotic medicine to treat infection. ? Medicine to help any remaining tissue pass out of your uterus. ? Medicine to reduce (contract) the size of the uterus. These medicines may be given if you have a lot of bleeding. If you have Rh negative blood and your baby was Rh positive, you will need a shot of medicine called Rh immunoglobulinto protect future babies from Rh blood problems. "Rh-negative" and "Rh-positive" refer to whether or not the blood has a specific protein found on the surface of red blood cells (Rh factor). Follow these instructions at home: Medicines   Take over-the-counter and prescription medicines only as told by your health care provider.  If you were prescribed antibiotic medicine, take your antibiotic as told by your health care provider. Do not stop taking the antibiotic even if you start to feel better.  Do not take NSAIDs, such as aspirin and ibuprofen, unless approved by your doctor. These medicines can cause bleeding. Activity  Rest as directed. Ask your health care provider what activities are safe for you.  Have someone help with home and family responsibilities during this time. General instructions  Keep track of the number of sanitary pads you use each day and how soaked (saturated) they are. Write down this information.  Monitor the amount of tissue or blood clots that you pass from your vagina. Save any large amounts of tissue for your health care provider to examine.  Do not use tampons, douche, or have sex until your health care provider   approves.  To help you and your partner with the process of grieving, talk with your health care provider or seek counseling to help cope with the pregnancy loss.  When you are ready, meet with your health care provider to discuss important steps you should take for your health, as well as steps to take in  order to have a healthy pregnancy in the future.  Keep all follow-up visits as told by your health care provider. This is important. Where to find more information  The American Congress of Obstetricians and Gynecologists: www.acog.org  [place] Department of Health and Human Services Office of Women's Health: www.womenshealth.gov Contact a health care provider if:  You have a fever or chills.  You have a foul smelling vaginal discharge. Get help right away if:  You have severe cramps or pain in your back or abdomen.  You pass walnut-sized (or larger) blood clots or tissue from your vagina.  You have heavy bleeding, soaking more than 1 regular sanitary pad in an hour.  You become lightheaded or weak.  You pass out.  You have feelings of sadness that take over your thoughts, or you have thoughts of hurting yourself. Summary  In an incomplete miscarriage, parts of the fetus or placenta (afterbirth) remain in the body.  There are multiple treatment options for an incomplete miscarriage, talk to your health care provider about the best option for you.  Follow your health care provider's instructions for follow-up care.  To help you and your partner with the process of grieving, talk with your health care provider or seek counseling to help cope with the pregnancy loss. This information is not intended to replace advice given to you by your health care provider. Make sure you discuss any questions you have with your health care provider. Document Revised: 07/08/2017 Document Reviewed: 07/08/2016 Elsevier Patient Education  2020 [employer]  

## 2020-05-02 MED ORDER — ONDANSETRON HCL 4 MG/2ML IJ SOLN
4.0000 mg | Freq: Once | INTRAMUSCULAR | Status: AC
  Administered 2020-05-02: 4 mg via INTRAVENOUS
  Filled 2020-05-02: qty 2

## 2020-05-02 NOTE — MAU Note (Signed)
Went in to check on pt who was resting comfortably. Asked about ginger ale and crackers and she responded yes. Then she sat up and vomited. Pt smiles at most questions even when she stated earlier her pain was 10. States this is the second time she has vomited since admission.

## 2020-05-02 NOTE — MAU Note (Signed)
Pt d//c by Florencia Reasons RN

## 2020-05-02 NOTE — Discharge Instructions (Signed)

## 2020-05-03 LAB — SURGICAL PATHOLOGY

## 2020-05-05 ENCOUNTER — Inpatient Hospital Stay (HOSPITAL_COMMUNITY): Payer: Medicaid Other

## 2020-05-05 ENCOUNTER — Inpatient Hospital Stay (HOSPITAL_COMMUNITY)
Admission: AD | Admit: 2020-05-05 | Discharge: 2020-05-06 | Disposition: A | Discharge status: Home / Self Care | Payer: Medicaid Other | Attending: Obstetrics & Gynecology | Admitting: Obstetrics & Gynecology

## 2020-05-05 ENCOUNTER — Encounter (HOSPITAL_COMMUNITY): Payer: Self-pay | Admitting: Obstetrics & Gynecology

## 2020-05-05 ENCOUNTER — Other Ambulatory Visit: Payer: Self-pay

## 2020-05-05 DIAGNOSIS — O360191 Maternal care for anti-D [Rh] antibodies, unspecified trimester, fetus 1: Secondary | ICD-10-CM | POA: Diagnosis not present

## 2020-05-05 DIAGNOSIS — O99019 Anemia complicating pregnancy, unspecified trimester: Secondary | ICD-10-CM | POA: Diagnosis not present

## 2020-05-05 DIAGNOSIS — Z6790 Unspecified blood type, Rh positive: Secondary | ICD-10-CM | POA: Diagnosis not present

## 2020-05-05 DIAGNOSIS — R55 Syncope and collapse: Secondary | ICD-10-CM | POA: Insufficient documentation

## 2020-05-05 DIAGNOSIS — Z8759 Personal history of other complications of pregnancy, childbirth and the puerperium: Secondary | ICD-10-CM | POA: Diagnosis not present

## 2020-05-05 DIAGNOSIS — R9431 Abnormal electrocardiogram [ECG] [EKG]: Secondary | ICD-10-CM | POA: Diagnosis not present

## 2020-05-05 DIAGNOSIS — D508 Other iron deficiency anemias: Secondary | ICD-10-CM | POA: Diagnosis not present

## 2020-05-05 DIAGNOSIS — R079 Chest pain, unspecified: Secondary | ICD-10-CM | POA: Insufficient documentation

## 2020-05-05 DIAGNOSIS — R42 Dizziness and giddiness: Secondary | ICD-10-CM | POA: Diagnosis not present

## 2020-05-05 DIAGNOSIS — N939 Abnormal uterine and vaginal bleeding, unspecified: Secondary | ICD-10-CM | POA: Diagnosis not present

## 2020-05-05 DIAGNOSIS — O200 Threatened abortion: Secondary | ICD-10-CM | POA: Diagnosis not present

## 2020-05-05 DIAGNOSIS — N8500 Endometrial hyperplasia, unspecified: Secondary | ICD-10-CM | POA: Diagnosis not present

## 2020-05-05 DIAGNOSIS — N854 Malposition of uterus: Secondary | ICD-10-CM | POA: Diagnosis not present

## 2020-05-05 DIAGNOSIS — Z3A00 Weeks of gestation of pregnancy not specified: Secondary | ICD-10-CM | POA: Diagnosis not present

## 2020-05-05 LAB — URINALYSIS, ROUTINE W REFLEX MICROSCOPIC
Bacteria, UA: NONE SEEN
Bilirubin Urine: NEGATIVE
Glucose, UA: NEGATIVE mg/dL
Ketones, ur: 5 mg/dL — AB
Leukocytes,Ua: NEGATIVE
Nitrite: NEGATIVE
Protein, ur: NEGATIVE mg/dL
Specific Gravity, Urine: 1.009 (ref 1.005–1.030)
pH: 8 (ref 5.0–8.0)

## 2020-05-05 LAB — CBC
HCT: 24.7 % — ABNORMAL LOW (ref 36.0–46.0)
Hemoglobin: 8.2 g/dL — ABNORMAL LOW (ref 12.0–15.0)
MCH: 29.5 pg (ref 26.0–34.0)
MCHC: 33.2 g/dL (ref 30.0–36.0)
MCV: 88.8 fL (ref 80.0–100.0)
Platelets: 231 10*3/uL (ref 150–400)
RBC: 2.78 MIL/uL — ABNORMAL LOW (ref 3.87–5.11)
RDW: 13.2 % (ref 11.5–15.5)
WBC: 5.3 10*3/uL (ref 4.0–10.5)
nRBC: 0 % (ref 0.0–0.2)

## 2020-05-05 LAB — HCG, QUANTITATIVE, PREGNANCY: hCG, Beta Chain, Quant, S: 211 m[IU]/mL — ABNORMAL HIGH (ref <5)

## 2020-05-05 MED ORDER — ACETAMINOPHEN 500 MG PO TABS
1000.0000 mg | ORAL_TABLET | Freq: Once | ORAL | Status: AC
  Administered 2020-05-05: 1000 mg via ORAL
  Filled 2020-05-05: qty 2

## 2020-05-05 MED ORDER — BUTALBITAL-APAP-CAFFEINE 50-325-40 MG PO TABS
1.0000 | ORAL_TABLET | Freq: Once | ORAL | Status: AC
  Administered 2020-05-05: 1 via ORAL
  Filled 2020-05-05: qty 1

## 2020-05-05 NOTE — ED Provider Notes (Signed)
Treated for miscarriage 5 days ago, medical at MAU Feeling terrible since dizzy/light headed, fainted once  Fainted while sitting down yesterday Continued vaginal bleeding, getting lighter 3-4 regular pads changed today  No CP SOB palpitations No vomiting, diarrhea No abdominal pain No known cardiac problems No known medical problems   HR 120, regular. Lungs clear. Soft non tender abdomen. Non toxic appearing.   Will do EKG here. Appropriate for MAU transfer after EKG. RN Shanda Bumps spoke to MAU provider agrees with transport.    Liberty Handy, PA-C 05/05/20 1934    Sheri Monday, MD 05/06/20 [phone]

## 2020-05-05 NOTE — MAU Provider Note (Cosign Needed)
History     CSN: [id number]  Arrival date and time: 05/05/20 [id number]   First Provider Initiated Contact with Patient 05/05/20 2104      Chief Complaint  Patient presents with  . Dizziness   [name] is a 29 y.o. [phone] at [id number] who presents to MAU for dizziness and lightheadedness. Patient reports this is not happening at this time. Patient had complete SAB 4-5 days ago in MAU. Patient reports she is changing 3 pads per day since that time, and the bleeding is not as heavy as a period.  Onset: this morning Location: head Duration: <24hours Character: intermittent, denies room spinning, currently has 7/10 HA Aggravating/Associated: none/none Relieving: none Treatment: took Tramadol for HA at 5PM, did not work Severity: 7/10 HA  Pt denies VB, LOF, ctx, decreased FM, vaginal discharge/odor/itching. Pt denies N/V, abdominal pain, constipation, diarrhea, or urinary problems. Pt denies fever, chills, fatigue, sweating or changes in appetite. Pt denies SOB or chest pain. Pt denies weakness.  Allergies? NKDA Current medications/supplements? none [facility]renatal care provider? [facility], no SAB f/u scheduled   OB History    Gravida  4   Para  3   Term  3   Preterm      AB  1   Living  3     SAB  1   TAB      Ectopic      Multiple  0   Live Births  3           Past Medical History:  Diagnosis Date  . Medical history non-contributory     Past Surgical History:  Procedure Laterality Date  . NO PAST SURGERIES      Family History  Problem Relation Age of Onset  . Cancer Neg Hx   . Diabetes Neg Hx   . Heart failure Neg Hx     Social History   Tobacco Use  . Smoking status: Never Smoker  . Smokeless tobacco: Never Used  Vaping Use  . Vaping Use: Never used  Substance Use Topics  . Alcohol use: No  . Drug use: No    Allergies: No Known Allergies  Medications Prior to Admission  Medication Sig Dispense Refill Last Dose  . ibuprofen  (ADVIL) 200 MG tablet Take 200 mg by mouth every 6 (six) hours as needed. Took 4 tablets this am and none since     . Prenatal Vit-Fe Fumarate-FA (PRENATAL MULTIVITAMIN) TABS tablet Take 1 tablet by mouth daily at 12 noon.     . promethazine (PHENERGAN) 25 MG tablet Take 1 tablet (25 mg total) by mouth every 6 (six) hours as needed for nausea or vomiting. 15 tablet 0   . sertraline (ZOLOFT) 25 MG tablet Take 1 tablet (25 mg total) by mouth daily. 30 tablet 0   . traMADol (ULTRAM) 50 MG tablet Take 1 tablet (50 mg total) by mouth every 6 (six) hours as needed. 10 tablet 0     Review of Systems  Constitutional: Negative for chills, diaphoresis, fatigue and fever.  Eyes: Negative for visual disturbance.  Respiratory: Negative for shortness of breath.   Cardiovascular: Negative for chest pain.  Gastrointestinal: Negative for abdominal pain, constipation, diarrhea, nausea and vomiting.  Genitourinary: Positive for vaginal bleeding. Negative for dysuria, flank pain, frequency, pelvic pain, urgency and vaginal discharge.  Neurological: Positive for headaches. Negative for dizziness, weakness and light-headedness.   Physical Exam   Blood pressure 123/69, pulse 77, temperature 98.7 F (37.1  C), temperature source Oral, resp. rate 16, SpO2 98 %, unknown if currently breastfeeding.  Patient Vitals for the past 24 hrs:  BP Temp Temp src Pulse Resp SpO2  05/05/20 2102 123/69 -- -- 77 -- --  05/05/20 2045 (!) 117/53 -- -- 79 -- 98 %  05/05/20 2038 (!) 114/53 -- -- 85 -- --  05/05/20 2035 -- -- -- -- -- 98 %  05/05/20 2023 (!) 111/51 98.7 F (37.1 C) Oral 89 16 100 %  05/05/20 1900 (!) 136/54 98.2 F (36.8 C) -- (!) 124 18 100 %   Physical Exam Vitals and nursing note reviewed.  Constitutional:      General: She is not in acute distress.    Appearance: Normal appearance. She is not ill-appearing, toxic-appearing or diaphoretic.  HENT:     Head: Normocephalic and atraumatic.  Pulmonary:      Effort: Pulmonary effort is normal.  Neurological:     Mental Status: She is alert and oriented to person, place, and time.  Psychiatric:        Mood and Affect: Mood normal.        Behavior: Behavior normal.        Thought Content: Thought content normal.        Judgment: Judgment normal.    Results for orders placed or performed during the hospital encounter of 05/05/20 (from the past 24 hour(s))  Urinalysis, Routine w reflex microscopic Urine, Clean Catch     Status: Abnormal   Collection Time: 05/05/20  9:01 PM  Result Value Ref Range   Color, Urine STRAW (A) YELLOW   APPearance CLEAR CLEAR   Specific Gravity, Urine 1.009 1.005 - 1.030   pH 8.0 5.0 - 8.0   Glucose, UA NEGATIVE NEGATIVE mg/dL   Hgb urine dipstick LARGE (A) NEGATIVE   Bilirubin Urine NEGATIVE NEGATIVE   Ketones, ur 5 (A) NEGATIVE mg/dL   Protein, ur NEGATIVE NEGATIVE mg/dL   Nitrite NEGATIVE NEGATIVE   Leukocytes,Ua NEGATIVE NEGATIVE   RBC / HPF 21-50 0 - 5 RBC/hpf   WBC, UA 0-5 0 - 5 WBC/hpf   Bacteria, UA NONE SEEN NONE SEEN   Squamous Epithelial / LPF 0-5 0 - 5   Mucus PRESENT   CBC     Status: Abnormal   Collection Time: 05/05/20  9:22 PM  Result Value Ref Range   WBC 5.3 4.0 - 10.5 K/uL   RBC 2.78 (L) 3.87 - 5.11 MIL/uL   Hemoglobin 8.2 (L) 12.0 - 15.0 g/dL   HCT 56.2 (L) 36 - 46 %   MCV 88.8 80.0 - 100.0 fL   MCH 29.5 26.0 - 34.0 pg   MCHC 33.2 30.0 - 36.0 g/dL   RDW 13.0 86.5 - 78.4 %   Platelets 231 150 - 400 K/uL   nRBC 0.0 0.0 - 0.2 %  hCG, quantitative, pregnancy     Status: Abnormal   Collection Time: 05/05/20  9:22 PM  Result Value Ref Range   hCG, Beta Chain, Quant, S 211 (H) <5 mIU/mL   US OB Comp Less 14 Wks  Result Date: 05/01/2020 CLINICAL DATA:  29 year old female with vaginal bleeding in the 1st trimester of pregnancy, and definitive findings of failed pregnancy on ultrasound earlier today. Query retained products of conception. EXAM: OBSTETRIC <14 WK ULTRASOUND TECHNIQUE:  Transabdominal ultrasound was performed for evaluation of the gestation as well as the maternal uterus and adnexal regions. COMPARISON:  Ultrasound 0217 hours today. FINDINGS: Intrauterine gestational sac: None.  Maternal uterus/adnexae: Uterine endometrial thickening up to 18 mm (image 16) and mildly heterogeneous (image 24), but with color Doppler interrogation no hypervascular areas are identified (images 17 and 18). No pelvic free fluid. Neither ovary is identified on these images. IMPRESSION: Absent IUP now with thickened endometrium but no definite retained products of conception. Electronically Signed   By: Odessa Fleming M.D.   On: 05/01/2020 21:45   US OB Comp Less 14 Wks  Result Date: 05/01/2020 CLINICAL DATA:  Initial evaluation for acute vaginal bleeding, cramping. Early pregnancy. EXAM: OBSTETRIC <14 WK ULTRASOUND TECHNIQUE: Transabdominal ultrasound was performed for evaluation of the gestation as well as the maternal uterus and adnexal regions. COMPARISON:  None. FINDINGS: Intrauterine gestational sac: Single Yolk sac:  Negative. Embryo:  Present. Cardiac Activity: Negative. Heart Rate: N/A CRL: 33.7 mm   10 w 1 d                  [place] EDC: 11/26/2020 Subchorionic hemorrhage:  None visualized. Maternal uterus/adnexae: Right ovary within normal limits. Left ovary not visualized. No adnexal mass or free fluid. IMPRESSION: 1. Single intrauterine pregnancy with internal embryo, crown-rump length measuring 33.7 mm, with no detectable cardiac activity. Findings meet definitive criteria for failed pregnancy. This follows SRU consensus guidelines: Diagnostic Criteria for Nonviable Pregnancy Early in the First Trimester. [facility] Mis J Med [phone]. 2. No other acute maternal uterine or adnexal abnormality. Electronically Signed   By: Rise Mu M.D.   On: 05/01/2020 02:47   US PELVIS (TRANSABDOMINAL ONLY)  Result Date: 05/05/2020 CLINICAL DATA:  Spontaneous abortion 5 days ago. Excessive vaginal  bleeding, passing of clots, syncopal episode. Quantitative beta HCG is 211 today. EXAM: TRANSABDOMINAL ULTRASOUND OF PELVIS TECHNIQUE: Transabdominal ultrasound examination of the pelvis was performed including evaluation of the uterus, ovaries, adnexal regions, and pelvic cul-de-sac. COMPARISON:  05/01/2020 FINDINGS: Uterus Measurements: 8.6 x 5.6 x 6.8 cm = volume: 134 mL. Uterus is retroverted. No myometrial mass lesions identified. Endometrium Thickness: 13 mm. Mildly thickened endometrium. No endometrial collections. Flow is demonstrated within the endometrium on color flow Doppler imaging. Right ovary Measurements: 3.4 x 2.3 x 2.7 cm = volume: 11 mL. Normal appearance/no adnexal mass. Left ovary Measurements: 3.6 x 1.3 x 1.8 cm = volume: 4 mL. Normal appearance/no adnexal mass. Other findings:  No abnormal free fluid. IMPRESSION: Mild thickening of endometrium with flow demonstrated within the endometrium. Endometrial flow may indicate retained products of conception, but endometrial thickness is decreased since previous study. No endometrial collections. Ovaries are normal. No free fluid. Electronically Signed   By: Burman Nieves M.D.   On: 05/05/2020 23:07    MAU Course  Procedures  MDM -s/p 14 week miscarriage on 05/01/2020 with significant bleeding -HA, dizziness, lightheadedness and reported episode of syncope to ED provider, but denied to MAU provider -UA: straw/lg hgb/5ketones -CBC: H/H 8.2/24.7, dropped from 8.9/26.8 4 days ago -hCG: 211 (no previous comparison) -[place]: Mild thickening of endometrium with flow demonstrated within the endometrium. Endometrial flow may indicate retained products of conception, but endometrial thickness is decreased since previous study. No endometrial collections. Ovaries are normal. No free fluid. -pt reports after Tylenol no change in HA -consulted Dr. Despina Hidden d/t symptomatic patient and dropping hgb. Per Dr .Despina Hidden can treat HA with Fioricet (OK to give  after Tylenol) and does not need blood transfusion at this time, can send home with RX for Cytotec 200mg  TID for 3days, and RX for iron as well as Fioricet -after Fioricet, pt  reports HA now 1/10 -pt discharged to home in stable condition  Orders Placed This Encounter  Procedures  . US PELVIS (TRANSABDOMINAL ONLY)    Please assess for retained POCs.    Standing Status:   Standing    Number of Occurrences:   1    Order Specific Question:   Symptom/Reason for Exam    Answer:   Excessive vaginal bleeding [2500370]  . Urinalysis, Routine w reflex microscopic    Standing Status:   Standing    Number of Occurrences:   1  . CBC    Standing Status:   Standing    Number of Occurrences:   1  . hCG, quantitative, pregnancy    Standing Status:   Standing    Number of Occurrences:   1  . ED EKG    Standing Status:   Standing    Number of Occurrences:   1    Order Specific Question:   Reason for Exam    Answer:   Chest Pain  . EKG 12-Lead    Standing Status:   Standing    Number of Occurrences:   1  . Discharge patient    Order Specific Question:   Discharge disposition    Answer:   01-Home or Self Care [1]    Order Specific Question:   Discharge patient date    Answer:   05/06/2020   Meds ordered this encounter  Medications  . acetaminophen (TYLENOL) tablet 1,000 mg  . butalbital-acetaminophen-caffeine (FIORICET) 50-325-40 MG per tablet 1 tablet  . ferrous sulfate 325 (65 FE) MG tablet    Sig: Take 1 tablet (325 mg total) by mouth daily.    Dispense:  30 tablet    Refill:  3    Order Specific Question:   Supervising Provider    Answer:   Despina Hidden, LUTHER H [[id number]]  . butalbital-acetaminophen-caffeine (FIORICET) 50-325-40 MG tablet    Sig: Take 1 tablet by mouth every 6 (six) hours as needed for headache.    Dispense:  20 tablet    Refill:  0    Order Specific Question:   Supervising Provider    Answer:   Despina Hidden, LUTHER H [[id number]]  . misoprostol (CYTOTEC) 200 MCG tablet    Sig: Take 1  tablet (200 mcg total) by mouth in the morning, at noon, and at bedtime for 3 days.    Dispense:  9 tablet    Refill:  0    Order Specific Question:   Supervising Provider    Answer:   Duane Lope H [[id number]]    Assessment and Plan   1. Miscarriage within last 12 months   2. Excessive vaginal bleeding   3. Other iron deficiency anemia   4. Blood type, Rh positive     Allergies as of 05/06/2020   No Known Allergies     Medication List    STOP taking these medications   traMADol 50 MG tablet Commonly known as: ULTRAM     TAKE these medications   butalbital-acetaminophen-caffeine 50-325-40 MG tablet Commonly known as: FIORICET Take 1 tablet by mouth every 6 (six) hours as needed for headache.   ferrous sulfate 325 (65 FE) MG tablet Take 1 tablet (325 mg total) by mouth daily.   ibuprofen 200 MG tablet Commonly known as: ADVIL Take 200 mg by mouth every 6 (six) hours as needed. Took 4 tablets this am and none since   misoprostol 200 MCG tablet Commonly known as: Cytotec Take 1  tablet (200 mcg total) by mouth in the morning, at noon, and at bedtime for 3 days.   prenatal multivitamin Tabs tablet Take 1 tablet by mouth daily at 12 noon.   promethazine 25 MG tablet Commonly known as: PHENERGAN Take 1 tablet (25 mg total) by mouth every 6 (six) hours as needed for nausea or vomiting.   sertraline 25 MG tablet Commonly known as: ZOLOFT Take 1 tablet (25 mg total) by mouth daily.      -message to [facility] to schedule SAB follow-up -RX iron -RX Cytotec 200mg  TID x3days -RX Fioricet -return MAU precautions given -pt discharged to home in stable condition  E Derelle Cockrell 05/06/2020, 12:22 AM

## 2020-05-05 NOTE — MAU Note (Signed)
Pt from ED with complaint of dizziness and lightheaded today. Changed pad 3-4 times today. Denies pain.

## 2020-05-05 NOTE — ED Triage Notes (Signed)
Pt arrives to Ed feeling lightheaded and fatigued, she had a miscarriage on 11/16 and states since then has continued to have vaginal bleeding. She has to change about 3 pads a day.

## 2020-05-06 ENCOUNTER — Telehealth: Payer: Self-pay | Admitting: *Deleted

## 2020-05-06 MED ORDER — FERROUS SULFATE 325 (65 FE) MG PO TABS: 325.0000 mg | ORAL_TABLET | Freq: Every day | ORAL | 3 refills | Status: DC

## 2020-05-06 MED ORDER — MISOPROSTOL 200 MCG PO TABS: 200.0000 ug | ORAL_TABLET | Freq: Three times a day (TID) | ORAL | 0 refills | Status: DC

## 2020-05-06 MED ORDER — BUTALBITAL-APAP-CAFFEINE 50-325-40 MG PO TABS: 1.0000 | ORAL_TABLET | Freq: Four times a day (QID) | ORAL | 0 refills | Status: AC | PRN

## 2020-05-06 NOTE — Telephone Encounter (Signed)
Spoke with patient.  States she is feeling better today but has not picked up the medication prescribed by the hospital yesterday.  Advised to go ahead and pick those up and we will get her scheduled for a f/u hcg next Monday.  Will need to follow the lab until <5.   Pt verbalized understanding and agreeable to plan.

## 2020-05-06 NOTE — Telephone Encounter (Signed)
-----   Message from Aviva Signs, CNM sent at 05/02/2020  1:36 AM EST ----- Regarding: Patient completed her miscarriage Sent message yest about this pt She had her SAB on her own today  Just needs a 2-4 wk post SAB appt  Sheri Hill

## 2020-05-06 NOTE — Discharge Instructions (Signed)
Anemia  Anemia is a condition in which you do not have enough red blood cells or hemoglobin. Hemoglobin is a substance in red blood cells that carries oxygen. When you do not have enough red blood cells or hemoglobin (are anemic), your body cannot get enough oxygen and your organs may not work properly. As a result, you may feel very tired or have other problems. What are the causes? Common causes of anemia include:  Excessive bleeding. Anemia can be caused by excessive bleeding inside or outside the body, including bleeding from the intestine or from periods in women.  Poor nutrition.  Long-lasting (chronic) kidney, thyroid, and liver disease.  Bone marrow disorders.  Cancer and treatments for cancer.  HIV (human immunodeficiency virus) and AIDS (acquired immunodeficiency syndrome).  Treatments for HIV and AIDS.  Spleen problems.  Blood disorders.  Infections, medicines, and autoimmune disorders that destroy red blood cells. What are the signs or symptoms? Symptoms of this condition include:  Minor weakness.  Dizziness.  Headache.  Feeling heartbeats that are irregular or faster than normal (palpitations).  Shortness of breath, especially with exercise.  Paleness.  Cold sensitivity.  Indigestion.  Nausea.  Difficulty sleeping.  Difficulty concentrating. Symptoms may occur suddenly or develop slowly. If your anemia is mild, you may not have symptoms. How is this diagnosed? This condition is diagnosed based on:  Blood tests.  Your medical history.  A physical exam.  Bone marrow biopsy. Your health care provider may also check your stool (feces) for blood and may do additional testing to look for the cause of your bleeding. You may also have other tests, including:  Imaging tests, such as a CT scan or MRI.  Endoscopy.  Colonoscopy. How is this treated? Treatment for this condition depends on the cause. If you continue to lose a lot of blood, you may  need to be treated at a hospital. Treatment may include:  Taking supplements of iron, vitamin S31, or folic acid.  Taking a hormone medicine (erythropoietin) that can help to stimulate red blood cell growth.  Having a blood transfusion. This may be needed if you lose a lot of blood.  Making changes to your diet.  Having surgery to remove your spleen. Follow these instructions at home:  Take over-the-counter and prescription medicines only as told by your health care provider.  Take supplements only as told by your health care provider.  Follow any diet instructions that you were given.  Keep all follow-up visits as told by your health care provider. This is important. Contact a health care provider if:  You develop new bleeding anywhere in the body. Get help right away if:  You are very weak.  You are short of breath.  You have pain in your abdomen or chest.  You are dizzy or feel faint.  You have trouble concentrating.  You have bloody or black, tarry stools.  You vomit repeatedly or you vomit up blood. Summary  Anemia is a condition in which you do not have enough red blood cells or enough of a substance in your red blood cells that carries oxygen (hemoglobin).  Symptoms may occur suddenly or develop slowly.  If your anemia is mild, you may not have symptoms.  This condition is diagnosed with blood tests as well as a medical history and physical exam. Other tests may be needed.  Treatment for this condition depends on the cause of the anemia. This information is not intended to replace advice given to you by  your health care provider. Make sure you discuss any questions you have with your health care provider. Document Revised: 05/14/2017 Document Reviewed: 07/03/2016 Elsevier Patient Education  2020 [place] bloodwork shows that you have anemia. I have sent you a prescription for ferrous sulfate 357m. Take 1 tablet, every other day. Take  iron without food, if possible. If it must be taken with food due to upset stomach, take with orange juice. Avoid taking with milk, cereal, tea, coffee and eggs. Common side effects of iron include: nausea, vomiting gas, constipation, diarrhea and black or green stool. If side effects prevent taking, you may take with orange juice to decrease stomach upset and increase absorption. Please contact our office if you have any questions or concerns. If you have side effects that are intolerable, please contact our office so your medication can be adjusted.

## 2020-05-13 ENCOUNTER — Other Ambulatory Visit: Payer: Self-pay

## 2020-05-27 ENCOUNTER — Telehealth: Payer: Self-pay | Admitting: Advanced Practice Midwife

## 2020-06-04 ENCOUNTER — Other Ambulatory Visit: Payer: Self-pay

## 2020-06-04 ENCOUNTER — Ambulatory Visit (HOSPITAL_COMMUNITY)
Admission: EM | Admit: 2020-06-04 | Discharge: 2020-06-04 | Disposition: A | Discharge status: Home / Self Care | Payer: Medicaid Other | Attending: Urgent Care | Admitting: Urgent Care

## 2020-06-04 ENCOUNTER — Ambulatory Visit (INDEPENDENT_AMBULATORY_CARE_PROVIDER_SITE_OTHER): Payer: Medicaid Other

## 2020-06-04 ENCOUNTER — Encounter (HOSPITAL_COMMUNITY): Payer: Self-pay

## 2020-06-04 DIAGNOSIS — M5412 Radiculopathy, cervical region: Secondary | ICD-10-CM | POA: Diagnosis not present

## 2020-06-04 DIAGNOSIS — M542 Cervicalgia: Secondary | ICD-10-CM | POA: Diagnosis not present

## 2020-06-04 DIAGNOSIS — R29818 Other symptoms and signs involving the nervous system: Secondary | ICD-10-CM

## 2020-06-04 MED ORDER — TIZANIDINE HCL 4 MG PO TABS: 4.0000 mg | ORAL_TABLET | Freq: Every day | ORAL | 0 refills | Status: DC

## 2020-06-04 MED ORDER — PREDNISONE 20 MG PO TABS: ORAL_TABLET | ORAL | 0 refills | Status: DC

## 2020-06-04 NOTE — ED Provider Notes (Signed)
[facility]   MRN: [id number] DOB: [birth year]  Subjective:   [facility] is a 29 y.o. female presenting for 3 day history of acute onset neck pain and stiffness that shoots down her left arm.  Patient states that she feels like she slept wrong.  Denies falls, trauma, history of medications, weakness, headaches, vision changes, confusion, chest pain, fevers, nausea, vomiting.  She has been using Tylenol with minimal relief.  No current facility-administered medications for this encounter.  Current Outpatient Medications:  .  butalbital-acetaminophen-caffeine (FIORICET) 50-325-40 MG tablet, Take 1 tablet by mouth every 6 (six) hours as needed for headache., Disp: 20 tablet, Rfl: 0 .  ferrous sulfate 325 (65 FE) MG tablet, Take 1 tablet (325 mg total) by mouth daily., Disp: 30 tablet, Rfl: 3 .  ibuprofen (ADVIL) 200 MG tablet, Take 200 mg by mouth every 6 (six) hours as needed. Took 4 tablets this am and none since, Disp: , Rfl:  .  misoprostol (CYTOTEC) 200 MCG tablet, Take 1 tablet (200 mcg total) by mouth in the morning, at noon, and at bedtime for 3 days., Disp: 9 tablet, Rfl: 0 .  Prenatal Vit-Fe Fumarate-FA (PRENATAL MULTIVITAMIN) TABS tablet, Take 1 tablet by mouth daily at 12 noon., Disp: , Rfl:  .  promethazine (PHENERGAN) 25 MG tablet, Take 1 tablet (25 mg total) by mouth every 6 (six) hours as needed for nausea or vomiting., Disp: 15 tablet, Rfl: 0 .  sertraline (ZOLOFT) 25 MG tablet, Take 1 tablet (25 mg total) by mouth daily., Disp: 30 tablet, Rfl: 0   No Known Allergies  Past Medical History:  Diagnosis Date  . Medical history non-contributory      Past Surgical History:  Procedure Laterality Date  . NO PAST SURGERIES      Family History  Problem Relation Age of Onset  . Cancer Neg Hx   . Diabetes Neg Hx   . Heart failure Neg Hx     Social History   Tobacco Use  . Smoking status: Never Smoker  . Smokeless tobacco: Never Used  Vaping Use  .  Vaping Use: Never used  Substance Use Topics  . Alcohol use: No  . Drug use: No    ROS   Objective:   Vitals: BP 137/84 (BP Location: Right Arm)   Pulse 94   Temp 98.5 F (36.9 C) (Oral)   Resp 18   LMP  (Within Weeks) Comment: 1 week  SpO2 100%   Physical Exam Constitutional:      General: She is not in acute distress.    Appearance: Normal appearance. She is well-developed. She is not ill-appearing, toxic-appearing or diaphoretic.  HENT:     Head: Normocephalic and atraumatic.     Nose: Nose normal.     Mouth/Throat:     Mouth: Mucous membranes are moist.     Pharynx: Oropharynx is clear.  Eyes:     General: No scleral icterus.       Right eye: No discharge.        Left eye: No discharge.     Extraocular Movements: Extraocular movements intact.     Conjunctiva/sclera: Conjunctivae normal.     Pupils: Pupils are equal, round, and reactive to light.  Cardiovascular:     Rate and Rhythm: Normal rate.  Pulmonary:     Effort: Pulmonary effort is normal.  Musculoskeletal:     Comments: Tenderness along the paraspinal muscles of the cervical region and her  midline.  Positive Spurling's maneuver to the left, positive Lhermitte sign.  Strength 5/5 for upper and lower extremities. Imaging rotation to the left at cervical level. Patient did experience relief of her pain when she abducted to her left arm to greater than 90 degrees above the shoulder.  Skin:    General: Skin is warm and dry.  Neurological:     General: No focal deficit present.     Mental Status: She is alert and oriented to person, place, and time.     Cranial Nerves: No cranial nerve deficit.     Motor: No weakness.     Coordination: Coordination normal.     Gait: Gait normal.     Deep Tendon Reflexes: Reflexes normal.  Psychiatric:        Mood and Affect: Mood normal.        Behavior: Behavior normal.        Thought Content: Thought content normal.        Judgment: Judgment normal.     DG Cervical  Spine Complete  Result Date: 06/04/2020 CLINICAL DATA:  Neck pain. EXAM: CERVICAL SPINE - COMPLETE 4+ VIEW COMPARISON:  No prior. FINDINGS: Loss of normal cervical lordosis. Cervicothoracic spine scoliosis. Diffuse osteopenia degenerative change. No evidence of fracture. Neuroforamen are widely patent. Left apical pleural thickening consistent scarring. IMPRESSION: Loss of normal cervical lordosis. Cervicothoracic spine scoliosis. Diffuse osteopenia degenerative change. No acute abnormality identified. Electronically Signed   By: Maisie Fus  Register   On: 06/04/2020 16:24    Assessment and Plan :   PDMP not reviewed this encounter.  1. Cervical radiculopathy   2. Neck pain   3. Lhermitte's sign positive     Despite a positive Lhermitte sign which warrants further follow-up with a new PCP (information given to the patient for this) will focus on addressing her symptoms for cervical radiculopathy with an oral prednisone course. Recommended Tylenol and muscle relaxant as well. Counseled patient on potential for adverse effects with medications prescribed/recommended today, ER and return-to-clinic precautions discussed, patient verbalized understanding.    Wallis Bamberg, PA-C 06/04/20 1652

## 2020-06-04 NOTE — ED Triage Notes (Signed)
Pt presents with neck pain x 3 days. States she slept in the wrong way and hurt her neck.

## 2020-06-24 NOTE — Telephone Encounter (Signed)
Opened in error

## 2020-10-09 ENCOUNTER — Emergency Department (HOSPITAL_COMMUNITY): Payer: Medicaid Other

## 2020-10-09 ENCOUNTER — Other Ambulatory Visit: Payer: Self-pay

## 2020-10-09 ENCOUNTER — Emergency Department (HOSPITAL_COMMUNITY)
Admission: EM | Admit: 2020-10-09 | Discharge: 2020-10-10 | Disposition: A | Discharge status: Home / Self Care | Payer: Medicaid Other | Attending: Emergency Medicine | Admitting: Emergency Medicine

## 2020-10-09 ENCOUNTER — Encounter (HOSPITAL_COMMUNITY): Payer: Self-pay | Admitting: Emergency Medicine

## 2020-10-09 DIAGNOSIS — J209 Acute bronchitis, unspecified: Secondary | ICD-10-CM

## 2020-10-09 DIAGNOSIS — R0602 Shortness of breath: Secondary | ICD-10-CM | POA: Diagnosis not present

## 2020-10-09 DIAGNOSIS — R062 Wheezing: Secondary | ICD-10-CM | POA: Diagnosis not present

## 2020-10-09 DIAGNOSIS — R000 Tachycardia, unspecified: Secondary | ICD-10-CM | POA: Diagnosis not present

## 2020-10-09 DIAGNOSIS — Z20822 Contact with and (suspected) exposure to covid-19: Secondary | ICD-10-CM | POA: Insufficient documentation

## 2020-10-09 DIAGNOSIS — R059 Cough, unspecified: Secondary | ICD-10-CM | POA: Diagnosis not present

## 2020-10-09 DIAGNOSIS — R509 Fever, unspecified: Secondary | ICD-10-CM | POA: Diagnosis not present

## 2020-10-09 LAB — CBC WITH DIFFERENTIAL/PLATELET
Abs Immature Granulocytes: 0.02 10*3/uL (ref 0.00–0.07)
Basophils Absolute: 0 10*3/uL (ref 0.0–0.1)
Basophils Relative: 1 %
Eosinophils Absolute: 0.4 10*3/uL (ref 0.0–0.5)
Eosinophils Relative: 5 %
HCT: 39 % (ref 36.0–46.0)
Hemoglobin: 11.8 g/dL — ABNORMAL LOW (ref 12.0–15.0)
Immature Granulocytes: 0 %
Lymphocytes Relative: 10 %
Lymphs Abs: 0.8 10*3/uL (ref 0.7–4.0)
MCH: 24.7 pg — ABNORMAL LOW (ref 26.0–34.0)
MCHC: 30.3 g/dL (ref 30.0–36.0)
MCV: 81.6 fL (ref 80.0–100.0)
Monocytes Absolute: 0.7 10*3/uL (ref 0.1–1.0)
Monocytes Relative: 8 %
Neutro Abs: 6.5 10*3/uL (ref 1.7–7.7)
Neutrophils Relative %: 76 %
Platelets: 214 10*3/uL (ref 150–400)
RBC: 4.78 MIL/uL (ref 3.87–5.11)
RDW: 18.9 % — ABNORMAL HIGH (ref 11.5–15.5)
WBC: 8.5 10*3/uL (ref 4.0–10.5)
nRBC: 0 % (ref 0.0–0.2)

## 2020-10-09 LAB — I-STAT BETA HCG BLOOD, ED (MC, WL, AP ONLY): I-stat hCG, quantitative: <5 m[IU]/mL (ref <5)

## 2020-10-09 LAB — RESP PANEL BY RT-PCR (FLU A&B, COVID) ARPGX2
Influenza A by PCR: NEGATIVE
Influenza B by PCR: NEGATIVE
SARS Coronavirus 2 by RT PCR: NEGATIVE

## 2020-10-09 MED ORDER — AEROCHAMBER PLUS FLO-VU MISC: 1.0000 | Freq: Once | Status: DC

## 2020-10-09 MED ORDER — ALBUTEROL SULFATE HFA 108 (90 BASE) MCG/ACT IN AERS
6.0000 | INHALATION_SPRAY | Freq: Once | RESPIRATORY_TRACT | Status: AC
  Administered 2020-10-09: 6 via RESPIRATORY_TRACT
  Filled 2020-10-09: qty 6.7

## 2020-10-09 NOTE — ED Triage Notes (Signed)
Pt has been short of breath for 2 days, has a cough.

## 2020-10-09 NOTE — ED Provider Notes (Signed)
[facility] EMERGENCY DEPARTMENT Provider Note   CSN: [id number] Arrival date & time: 10/09/20  2035     History Chief Complaint  Patient presents with  . Shortness of Breath    [facility] is a 30 y.o. female.  HPI      30 year old female with a history of depression presents with concern for shortness of breath and cough.  Reports sudden onset of shortness of breath.  Reports sudden onset of significant shortness of breath with associated cough.  Reports having a temperature at home of 100.5.  Cough is nonproductive.  Denies leg pain or swelling, recent surgeries or immobilization, history of DVT or PE, estrogen use, recent travel.  Denies any chest pain, nausea, vomiting, diarrhea.  Past Medical History:  Diagnosis Date  . Medical history non-contributory     Patient Active Problem List   Diagnosis Date Noted  . Missed abortion 05/01/2020  . Major depression 02/16/2020  . Suicide attempt (HCC) 02/13/2020  . [redacted] weeks gestation of pregnancy 04/16/2019  . Limited prenatal care 04/14/2019  . Oligohydramnios 04/14/2019  . Antenatal screening for malformation using ultrasonics 01/11/2019  . [redacted] weeks gestation of pregnancy 01/11/2019  . Umbilical hernia 08/23/2017    Past Surgical History:  Procedure Laterality Date  . NO PAST SURGERIES       OB History    Gravida  4   Para  3   Term  3   Preterm      AB  1   Living  3     SAB  1   IAB      Ectopic      Multiple  0   Live Births  3           Family History  Problem Relation Age of Onset  . Cancer Neg Hx   . Diabetes Neg Hx   . Heart failure Neg Hx     Social History   Tobacco Use  . Smoking status: Never Smoker  . Smokeless tobacco: Never Used  Vaping Use  . Vaping Use: Never used  Substance Use Topics  . Alcohol use: No  . Drug use: No    Home Medications Prior to Admission medications   Medication Sig Start Date End Date Taking? Authorizing Provider   butalbital-acetaminophen-caffeine (FIORICET) 50-325-40 MG tablet Take 1 tablet by mouth every 6 (six) hours as needed for headache. 05/06/20 05/06/21  Nugent, Odie Sera, NP  ferrous sulfate 325 (65 FE) MG tablet Take 1 tablet (325 mg total) by mouth daily. 05/06/20 05/06/21  Nugent, Odie Sera, NP  ibuprofen (ADVIL) 200 MG tablet Take 200 mg by mouth every 6 (six) hours as needed. Took 4 tablets this am and none since    [provider]  misoprostol (CYTOTEC) 200 MCG tablet Take 1 tablet (200 mcg total) by mouth in the morning, at noon, and at bedtime for 3 days. 05/06/20 05/09/20  Nugent, Odie Sera, NP  predniSONE (DELTASONE) 20 MG tablet Day 1-3: Take 3 tablets daily. Day 4-6: Take 2 tablets daily. Day 7-9: Take 1 tablet daily. Take tablets daily with breakfast. 06/04/20   Wallis Bamberg, PA-C  Prenatal Vit-Fe Fumarate-FA (PRENATAL MULTIVITAMIN) TABS tablet Take 1 tablet by mouth daily at 12 noon.    [provider]  promethazine (PHENERGAN) 25 MG tablet Take 1 tablet (25 mg total) by mouth every 6 (six) hours as needed for nausea or vomiting. 05/01/20   Aviva Signs, CNM  sertraline (ZOLOFT) 25 MG tablet Take 1 tablet (25 mg total) by mouth daily. 02/17/20 03/18/20  Aldean Baker, NP  tiZANidine (ZANAFLEX) 4 MG tablet Take 1 tablet (4 mg total) by mouth at bedtime. 06/04/20   Wallis Bamberg, PA-C    Allergies    Patient has no known allergies.  Review of Systems   Review of Systems  Constitutional: Positive for fever.  HENT: Negative for sore throat.   Eyes: Negative for visual disturbance.  Respiratory: Positive for cough and shortness of breath.   Cardiovascular: Negative for chest pain.  Gastrointestinal: Negative for abdominal pain, nausea and vomiting.  Genitourinary: Negative for difficulty urinating.  Skin: Negative for rash.  Neurological: Negative for syncope and headaches.    Physical Exam Updated Vital Signs BP (!) 136/94 (BP Location: Right Arm)   Pulse (!)  122   Temp 99.4 F (37.4 C) (Oral)   Resp (!) 22   Ht 5\' 4"  (1.626 m)   Wt 44.9 kg   SpO2 100%   BMI 16.99 kg/m   Physical Exam Vitals and nursing note reviewed.  Constitutional:      General: She is not in acute distress.    Appearance: She is well-developed. She is not diaphoretic.  HENT:     Head: Normocephalic and atraumatic.  Eyes:     Conjunctiva/sclera: Conjunctivae normal.  Cardiovascular:     Rate and Rhythm: Regular rhythm. Tachycardia present.     Heart sounds: Normal heart sounds. No murmur heard. No friction rub. No gallop.   Pulmonary:     Effort: Pulmonary effort is normal. Tachypnea present. No respiratory distress.     Breath sounds: Normal breath sounds. No wheezing or rales.  Abdominal:     General: There is no distension.     Palpations: Abdomen is soft.     Tenderness: There is no abdominal tenderness. There is no guarding.  Musculoskeletal:        General: No tenderness.     Cervical back: Normal range of motion.  Skin:    General: Skin is warm and dry.     Findings: No erythema or rash.  Neurological:     Mental Status: She is alert and oriented to person, place, and time.     ED Results / Procedures / Treatments   Labs (all labs ordered are listed, but only abnormal results are displayed) Labs Reviewed  RESP PANEL BY RT-PCR (FLU A&B, COVID) ARPGX2  CBC WITH DIFFERENTIAL/PLATELET  COMPREHENSIVE METABOLIC PANEL  I-STAT BETA HCG BLOOD, ED ([facility], [facility], [facility] ONLY)  TROPONIN I (HIGH SENSITIVITY)    EKG None  Radiology No results found.  Procedures Procedures   Medications Ordered in ED Medications  aerochamber plus with mask device 1 each (has no administration in time range)  albuterol (VENTOLIN HFA) 108 (90 Base) MCG/ACT inhaler 6 puff (6 puffs Inhalation Given 10/09/20 2131)    ED Course  I have reviewed the triage vital signs and the nursing notes.  Pertinent labs & imaging results that were available during my care of the patient  were reviewed by me and considered in my medical decision making (see chart for details).    MDM Rules/Calculators/A&P                          31 year old female with a history of depression presents with concern for shortness of breath and cough.  EKG shows a sinus tachycardia with diffuse ST depressions, slight  elevation in aVR and aVL which is new since prior.  Evaluated patient in triage given EKG changes, and paged Dr. Allyson Sabal discussed with cardiology fellow on-call.  Given clinical scenario I have low suspicion at this time that findings represent left main MI and am more concerned for rate related changes/possible pulmonary embolus.  Placed orders including troponin, BNP, portable CXR and CT PE study.  DDx includes pneumonia, myocarditis, PE, viral bronchitis, ACS.  Signed out to Dr. Blinda Leatherwood with pending studies.       Final Clinical Impression(s) / ED Diagnoses Final diagnoses:  Shortness of breath    Rx / DC Orders ED Discharge Orders    None       Alvira Monday, MD 10/10/20 1120

## 2020-10-09 NOTE — ED Triage Notes (Signed)
Emergency Medicine Provider Triage Evaluation Note  [name] , a 30 y.o. female  was evaluated in triage.  Pt complains of cough, wheezing, SHOB, fever, onset yesterday. No asthma history.   Review of Systems  Positive: Cough, SHOB, fever, wheezing Negative: Congestion, sore throat  Physical Exam  BP (!) 136/94 (BP Location: Right Arm)   Pulse (!) 122   Temp 99.4 F (37.4 C) (Oral)   Resp (!) 22   SpO2 100%  Gen:   Awake, no distress   HEENT:  Atraumatic  Resp:  Normal effort, lungs diminished, wheezing  Cardiac:  Normal rate  Abd:   Nondistended, nontender  MSK:   Moves extremities without difficulty  Neuro:  Speech clear   Medical Decision Making  Medically screening exam initiated at 9:23 PM.  Appropriate orders placed.  [name] was informed that the remainder of the evaluation will be completed by another provider, this initial triage assessment does not replace that evaluation, and the importance of remaining in the ED until their evaluation is complete.  Clinical Impression  Given albuterol inhaler, COVID test, CXR   Jeannie Fend, PA-C 10/09/20 2125

## 2020-10-09 NOTE — ED Provider Notes (Incomplete)
[facility] EMERGENCY DEPARTMENT Provider Note   CSN: [id number] Arrival date & time: 10/09/20  2035     History Chief Complaint  Patient presents with  . Shortness of Breath    [facility] is a 30 y.o. female.  HPI      30 year old female with a history of depression presents with concern for shortness of breath and cough.  Reports sudden onset of shortness of breath.  Reports sudden onset of significant shortness of breath with associated cough.  Reports having a temperature at home of 100.5.  Cough is nonproductive.  Denies leg pain or swelling, recent surgeries or immobilization, history of DVT or PE, estrogen use, recent travel.  Denies any chest pain, nausea, vomiting, diarrhea.  Past Medical History:  Diagnosis Date  . Medical history non-contributory     Patient Active Problem List   Diagnosis Date Noted  . Missed abortion 05/01/2020  . Major depression 02/16/2020  . Suicide attempt (HCC) 02/13/2020  . [redacted] weeks gestation of pregnancy 04/16/2019  . Limited prenatal care 04/14/2019  . Oligohydramnios 04/14/2019  . Antenatal screening for malformation using ultrasonics 01/11/2019  . [redacted] weeks gestation of pregnancy 01/11/2019  . Umbilical hernia 08/23/2017    Past Surgical History:  Procedure Laterality Date  . NO PAST SURGERIES       OB History    Gravida  4   Para  3   Term  3   Preterm      AB  1   Living  3     SAB  1   IAB      Ectopic      Multiple  0   Live Births  3           Family History  Problem Relation Age of Onset  . Cancer Neg Hx   . Diabetes Neg Hx   . Heart failure Neg Hx     Social History   Tobacco Use  . Smoking status: Never Smoker  . Smokeless tobacco: Never Used  Vaping Use  . Vaping Use: Never used  Substance Use Topics  . Alcohol use: No  . Drug use: No    Home Medications Prior to Admission medications   Medication Sig Start Date End Date Taking? Authorizing Provider   butalbital-acetaminophen-caffeine (FIORICET) 50-325-40 MG tablet Take 1 tablet by mouth every 6 (six) hours as needed for headache. 05/06/20 05/06/21  Nugent, Odie Sera, NP  ferrous sulfate 325 (65 FE) MG tablet Take 1 tablet (325 mg total) by mouth daily. 05/06/20 05/06/21  Nugent, Odie Sera, NP  ibuprofen (ADVIL) 200 MG tablet Take 200 mg by mouth every 6 (six) hours as needed. Took 4 tablets this am and none since    [provider]  misoprostol (CYTOTEC) 200 MCG tablet Take 1 tablet (200 mcg total) by mouth in the morning, at noon, and at bedtime for 3 days. 05/06/20 05/09/20  Nugent, Odie Sera, NP  predniSONE (DELTASONE) 20 MG tablet Day 1-3: Take 3 tablets daily. Day 4-6: Take 2 tablets daily. Day 7-9: Take 1 tablet daily. Take tablets daily with breakfast. 06/04/20   Wallis Bamberg, PA-C  Prenatal Vit-Fe Fumarate-FA (PRENATAL MULTIVITAMIN) TABS tablet Take 1 tablet by mouth daily at 12 noon.    [provider]  promethazine (PHENERGAN) 25 MG tablet Take 1 tablet (25 mg total) by mouth every 6 (six) hours as needed for nausea or vomiting. 05/01/20   Aviva Signs, CNM  sertraline (ZOLOFT) 25 MG tablet Take 1 tablet (25 mg total) by mouth daily. 02/17/20 03/18/20  Aldean Baker, NP  tiZANidine (ZANAFLEX) 4 MG tablet Take 1 tablet (4 mg total) by mouth at bedtime. 06/04/20   Wallis Bamberg, PA-C    Allergies    Patient has no known allergies.  Review of Systems   Review of Systems  Physical Exam Updated Vital Signs BP (!) 136/94 (BP Location: Right Arm)   Pulse (!) 122   Temp 99.4 F (37.4 C) (Oral)   Resp (!) 22   Ht 5\' 4"  (1.626 m)   Wt 44.9 kg   SpO2 100%   BMI 16.99 kg/m   Physical Exam  ED Results / Procedures / Treatments   Labs (all labs ordered are listed, but only abnormal results are displayed) Labs Reviewed  RESP PANEL BY RT-PCR (FLU A&B, COVID) ARPGX2  CBC WITH DIFFERENTIAL/PLATELET  COMPREHENSIVE METABOLIC PANEL  I-STAT BETA HCG BLOOD, ED ([facility], [facility],  [facility] ONLY)  TROPONIN I (HIGH SENSITIVITY)    EKG None  Radiology No results found.  Procedures Procedures {Remember to document critical care time when appropriate:1}  Medications Ordered in ED Medications  aerochamber plus with mask device 1 each (has no administration in time range)  albuterol (VENTOLIN HFA) 108 (90 Base) MCG/ACT inhaler 6 puff (6 puffs Inhalation Given 10/09/20 2131)    ED Course  I have reviewed the triage vital signs and the nursing notes.  Pertinent labs & imaging results that were available during my care of the patient were reviewed by me and considered in my medical decision making (see chart for details).    MDM Rules/Calculators/A&P                          29 year old female with a history of depression presents with concern for shortness of breath and cough.  EKG shows a sinus tachycardia with diffuse ST depressions, slight elevation in aVR and aVL which is new since prior.  Evaluated patient in triage given EKG changes, and paged Dr. 26 discussed with cardiology fellow on-call.  Given clinical scenario I have low suspicion at this time that findings represent left main MI and am more concerned for rate related changes/possible pulmonary embolus.  Placed orders including troponin, BNP, portable CXR and CT PE study.  DDx includes pneumonia, myocarditis, PE, viral bronchitis, ACS.       Final Clinical Impression(s) / ED Diagnoses Final diagnoses:  Shortness of breath    Rx / DC Orders ED Discharge Orders    None

## 2020-10-09 NOTE — Progress Notes (Signed)
Reviewed ECG with ED provider given patient with dyspnea/SOB. TWI in precordial leads as compared to 05/06/20 however no ST elevations that meet criteria.

## 2020-10-10 ENCOUNTER — Emergency Department (HOSPITAL_COMMUNITY): Payer: Medicaid Other

## 2020-10-10 DIAGNOSIS — R062 Wheezing: Secondary | ICD-10-CM | POA: Diagnosis not present

## 2020-10-10 DIAGNOSIS — R059 Cough, unspecified: Secondary | ICD-10-CM | POA: Diagnosis not present

## 2020-10-10 DIAGNOSIS — R0602 Shortness of breath: Secondary | ICD-10-CM | POA: Diagnosis not present

## 2020-10-10 DIAGNOSIS — R509 Fever, unspecified: Secondary | ICD-10-CM | POA: Diagnosis not present

## 2020-10-10 LAB — COMPREHENSIVE METABOLIC PANEL
ALT: 17 U/L (ref 0–44)
AST: 19 U/L (ref 15–41)
Albumin: 4.2 g/dL (ref 3.5–5.0)
Alkaline Phosphatase: 45 U/L (ref 38–126)
Anion gap: 12 (ref 5–15)
BUN: 10 mg/dL (ref 6–20)
CO2: 19 mmol/L — ABNORMAL LOW (ref 22–32)
Calcium: 9.2 mg/dL (ref 8.9–10.3)
Chloride: 107 mmol/L (ref 98–111)
Creatinine, Ser: 0.7 mg/dL (ref 0.44–1.00)
GFR, Estimated: >60 mL/min (ref >60)
Glucose, Bld: 98 mg/dL (ref 70–99)
Potassium: 3.9 mmol/L (ref 3.5–5.1)
Sodium: 138 mmol/L (ref 135–145)
Total Bilirubin: 0.7 mg/dL (ref 0.3–1.2)
Total Protein: 7.8 g/dL (ref 6.5–8.1)

## 2020-10-10 LAB — TROPONIN I (HIGH SENSITIVITY)
Troponin I (High Sensitivity): 3 ng/L (ref <18)
Troponin I (High Sensitivity): 3 ng/L (ref <18)

## 2020-10-10 LAB — BRAIN NATRIURETIC PEPTIDE: B Natriuretic Peptide: 14.9 pg/mL (ref 0.0–100.0)

## 2020-10-10 MED ORDER — ACETAMINOPHEN 500 MG PO TABS
1000.0000 mg | ORAL_TABLET | Freq: Once | ORAL | Status: AC
  Administered 2020-10-10: 1000 mg via ORAL
  Filled 2020-10-10: qty 2

## 2020-10-10 MED ORDER — SODIUM CHLORIDE 0.9 % IV BOLUS
1000.0000 mL | Freq: Once | INTRAVENOUS | Status: AC
  Administered 2020-10-10: 1000 mL via INTRAVENOUS

## 2020-10-10 MED ORDER — IOHEXOL 350 MG/ML SOLN
44.0000 mL | Freq: Once | INTRAVENOUS | Status: AC | PRN
  Administered 2020-10-10: 44 mL via INTRAVENOUS

## 2020-10-10 MED ORDER — PREDNISONE 20 MG PO TABS
40.0000 mg | ORAL_TABLET | Freq: Once | ORAL | Status: AC
  Administered 2020-10-10: 40 mg via ORAL
  Filled 2020-10-10: qty 2

## 2020-10-10 MED ORDER — PREDNISONE 20 MG PO TABS: 40.0000 mg | ORAL_TABLET | Freq: Every day | ORAL | 0 refills | Status: DC

## 2020-10-10 NOTE — ED Provider Notes (Signed)
Patient signed out to me to follow-up on work-up.  Work-up is now complete.  She has normal blood work including normal white blood cell count.  No evidence of congestive heart failure.  Chest x-ray does not show any evidence of pneumonia.  On exam patient has recurrent cough suggestive of upper respiratory infection causing her symptoms.  CT PE study is negative, no evidence of PE and no other pathology noted.   Gilda Crease, MD 10/10/20 0201

## 2020-10-10 NOTE — ED Notes (Signed)
Patient verbalizes understanding of discharge instructions. Opportunity for questioning and answers were provided. Armband removed by staff, pt discharged from ED ambulatory.   

## 2020-10-11 ENCOUNTER — Telehealth: Payer: Self-pay | Admitting: *Deleted

## 2020-10-11 NOTE — Telephone Encounter (Signed)
Transition Care Management Unsuccessful Follow-up Telephone Call  Date of discharge and from where:  10/10/2020 [facility]  Attempts:  1st Attempt  Reason for unsuccessful TCM follow-up call:  Left voice message

## 2020-10-14 NOTE — Telephone Encounter (Signed)
Transition Care Management Unsuccessful Follow-up Telephone Call  Date of discharge and from where:  10/09/2020 from [facility]  Attempts:  2nd Attempt  Reason for unsuccessful TCM follow-up call:  Left voice message

## 2020-10-15 NOTE — Telephone Encounter (Signed)
Transition Care Management Unsuccessful Follow-up Telephone Call  Date of discharge and from where:  10/09/2020 [facility]  Attempts:  3rd Attempt  Reason for unsuccessful TCM follow-up call:  Left voice message

## 2021-09-29 IMAGING — DX DG CHEST 1V PORT
1 series · 1 of 1 positions shown · non-contrast
Comparison: None.

CLINICAL DATA: Shortness of breath and cough for 2 days.

EXAM:
PORTABLE CHEST 1 VIEW

[chest ap]
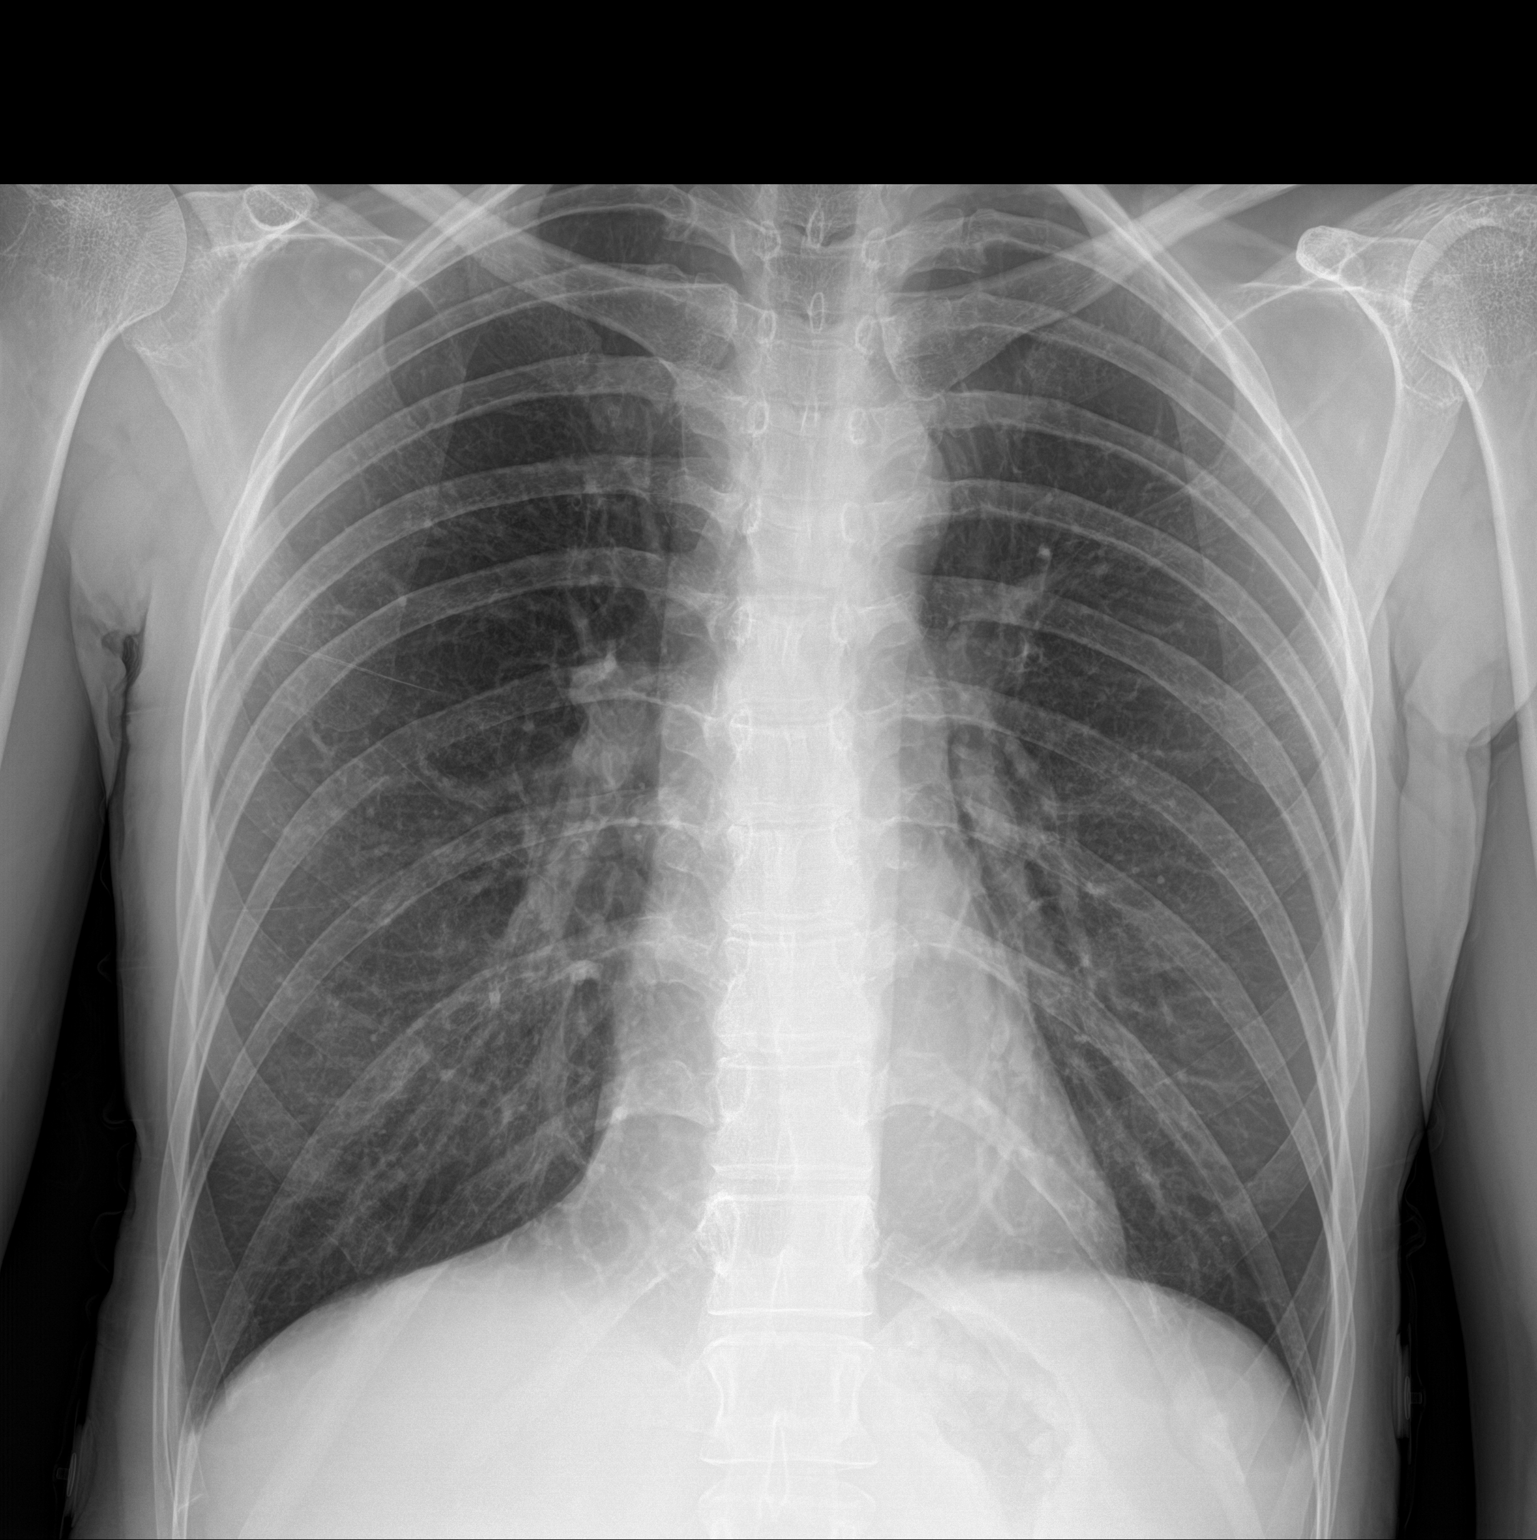

[1 of 1 positions shown; findings below may reference images not displayed]

FINDINGS: The cardiomediastinal contours are normal. The lungs are clear.
Pulmonary vasculature is normal. No consolidation, pleural effusion,
or pneumothorax. No acute osseous abnormalities are seen.
IMPRESSION: Negative AP view of the chest.

## 2021-09-30 IMAGING — CT CT ANGIO CHEST
3 of 7 series · 19 of 36 positions shown · IV contrast (omnipaque)
Comparison: None.

CLINICAL DATA: Pulmonary embolus suspected with high probability.
Cough, wheezing, shortness of breath, and fever since yesterday.

EXAM:
CT ANGIOGRAPHY CHEST WITH CONTRAST
TECHNIQUE: Multidetector CT imaging of the chest was performed using the
standard protocol during bolus administration of intravenous
contrast. Multiplanar CT image reconstructions and MIPs were
obtained to evaluate the vascular anatomy.
CONTRAST:  44mL OMNIPAQUE IOHEXOL 350 MG/ML SOLN

[Series 6: pe thins · axial · 0.59mm/px · z∈[+1169,+1457]mm · 15 of 471 slices shown]
[im 30/471  lung]
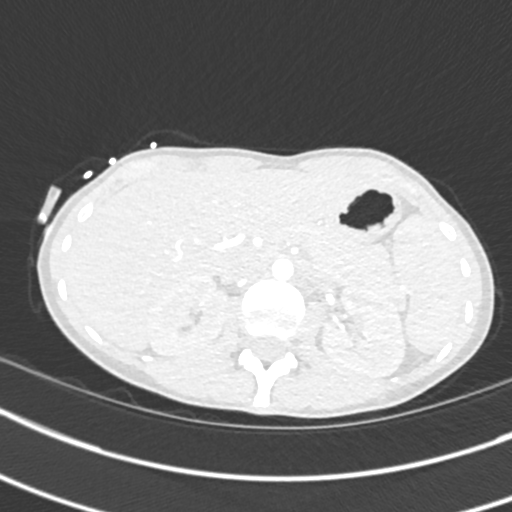
[im 59/471  mediastinal]
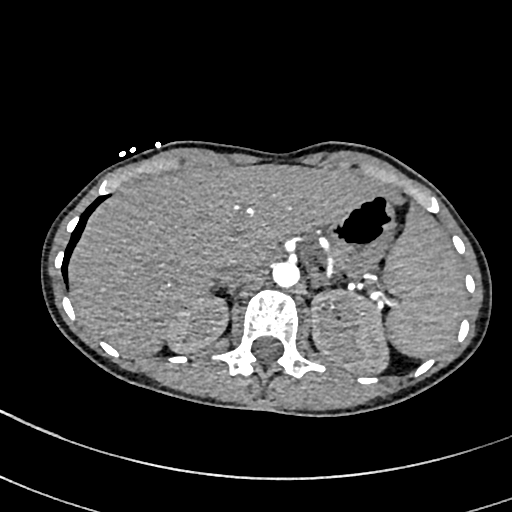
[im 89/471  lung]
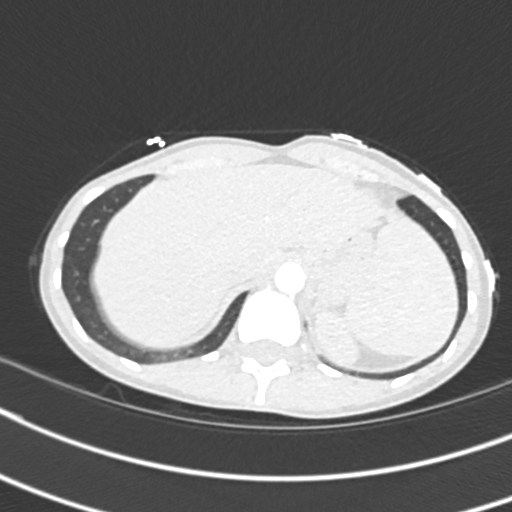
[im 118/471  mediastinal]
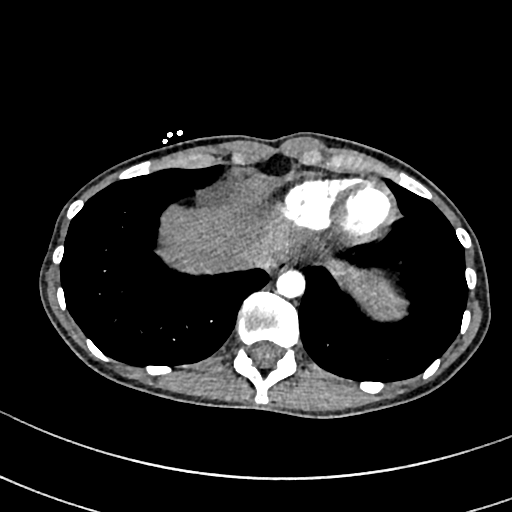
[im 147/471  lung]
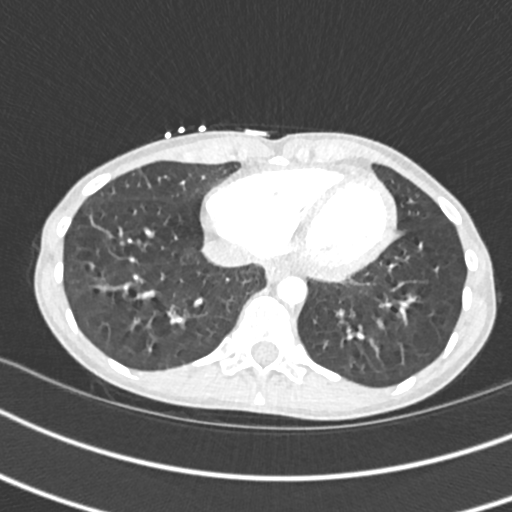
[im 177/471  mediastinal]
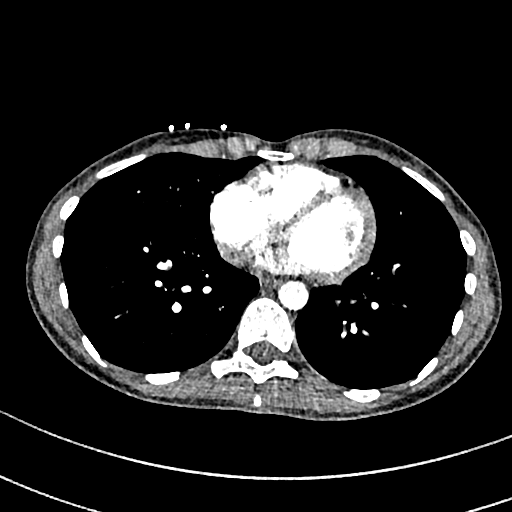
[im 206/471  lung]
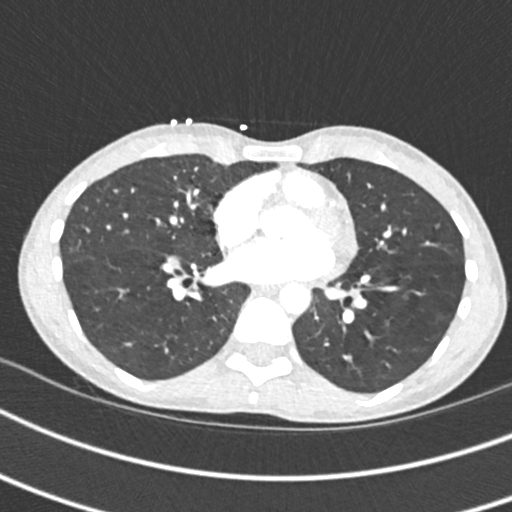
[im 236/471  mediastinal]
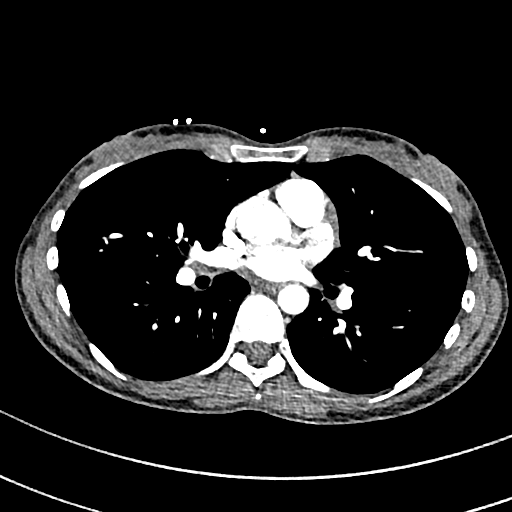
[im 265/471  lung]
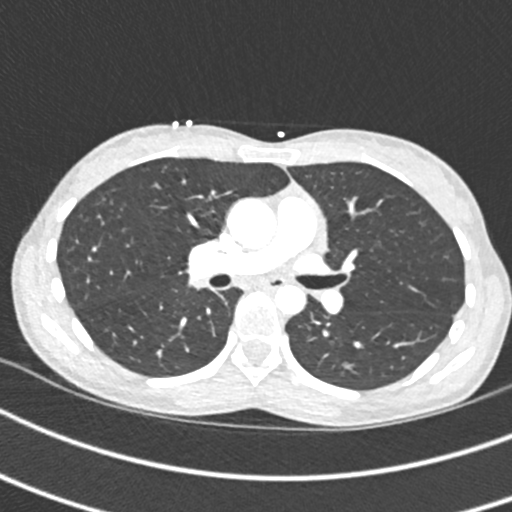
[im 294/471  mediastinal]
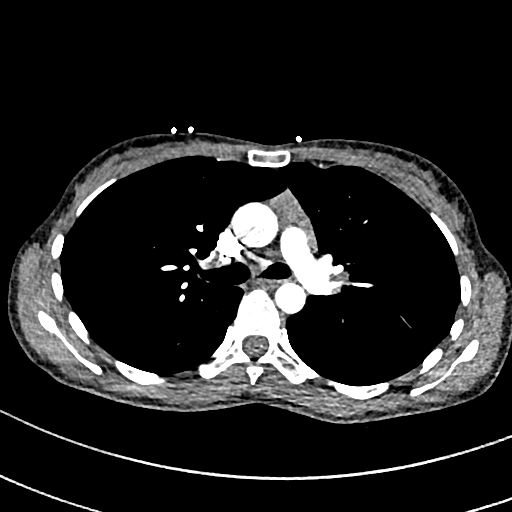
[im 324/471  lung]
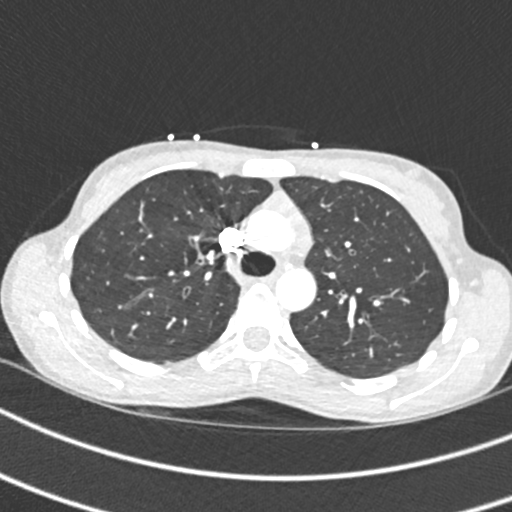
[im 353/471  mediastinal]
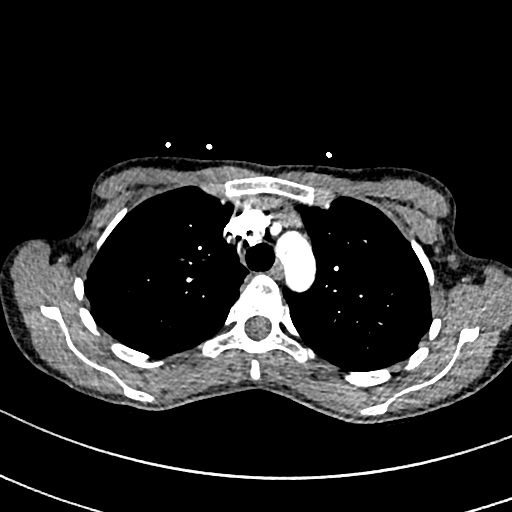
[im 382/471  lung]
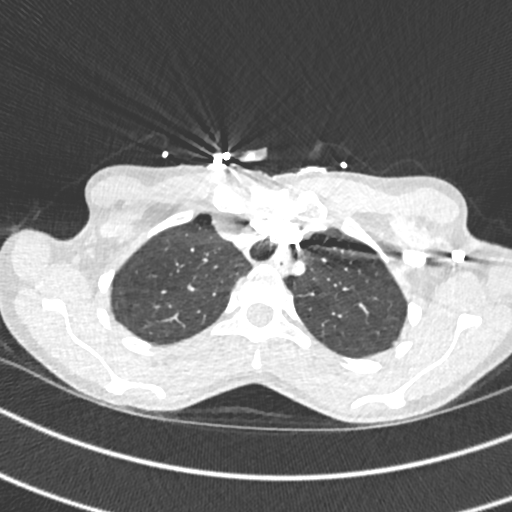
[im 412/471  mediastinal]
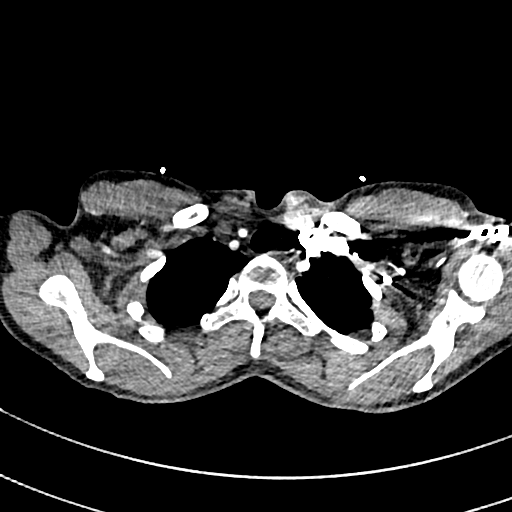
[im 441/471  lung]
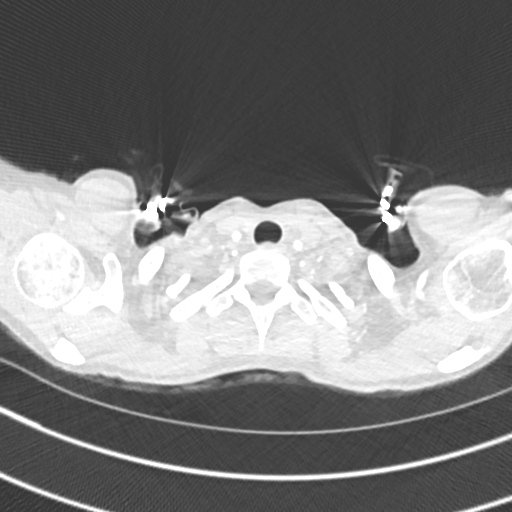

[Series 7: pe 2mm cor · coronal · 0.58mm/px · 1 of 102 slices shown]
[im 51/102  mediastinal]
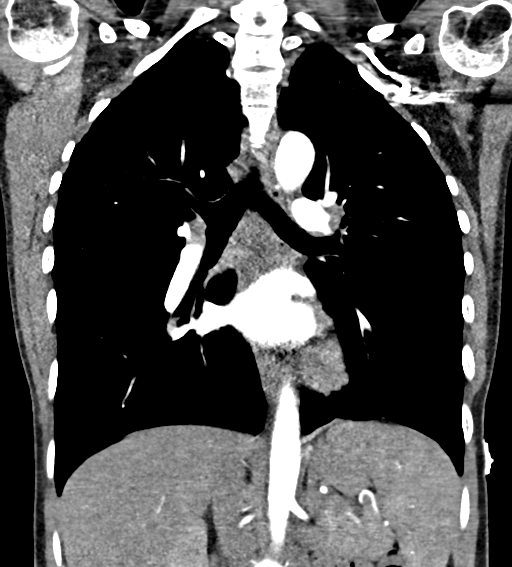

[Series 11: pe lung · axial · 0.59mm/px · z∈[+1254,+1402]mm · 3 of 150 slices shown]
[im 38/150  mediastinal]
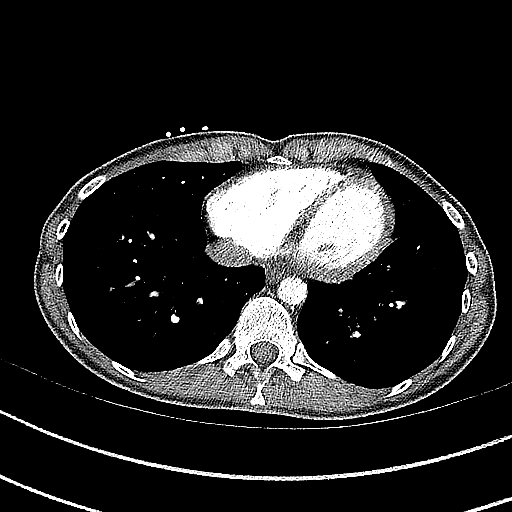
[im 75/150  mediastinal]
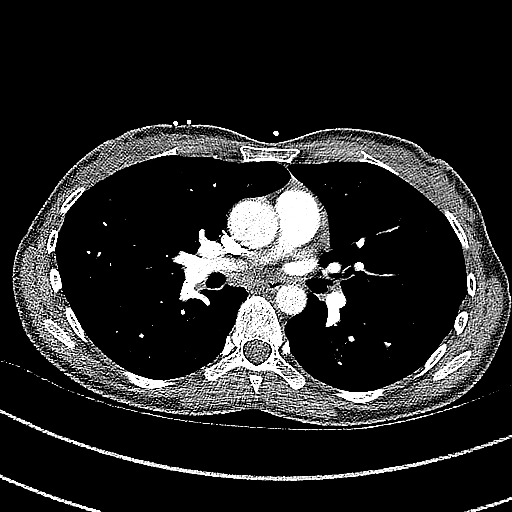
[im 112/150  mediastinal]
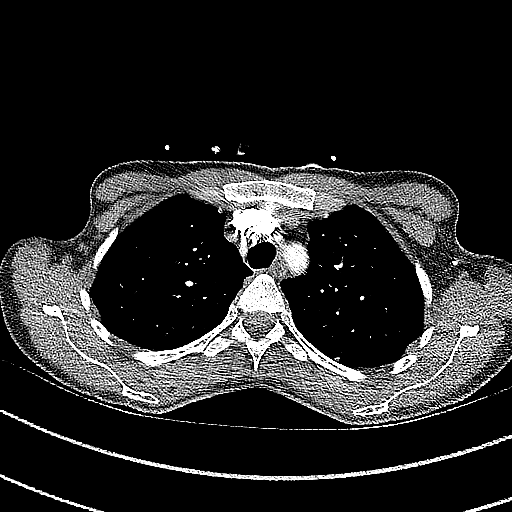

[19 of 36 positions shown; findings below may reference images not displayed]

FINDINGS: Cardiovascular: There is good opacification of the central and
segmental pulmonary arteries. No focal filling defects. No evidence
of significant pulmonary embolus. Normal heart size. No pericardial
effusions. Normal caliber thoracic aorta. No aortic dissection.
Great vessel origins are patent.

Mediastinum/Nodes: Increased density in the anterior mediastinum
likely represents residual thymic tissue. Esophagus is decompressed.
No significant lymphadenopathy.

Lungs/Pleura: Lungs are clear. No pleural effusions. No
pneumothorax.

Upper Abdomen: No acute process demonstrated in the visualized upper
abdomen.

Musculoskeletal: No chest wall abnormality. No acute or significant
osseous findings.

Review of the MIP images confirms the above findings.
IMPRESSION: 1. No evidence of significant pulmonary embolus.
2. No evidence of active pulmonary disease.

## 2022-02-13 DIAGNOSIS — O039 Complete or unspecified spontaneous abortion without complication: Secondary | ICD-10-CM

## 2022-02-13 HISTORY — DX: Complete or unspecified spontaneous abortion without complication: O03.9

## 2022-02-21 ENCOUNTER — Emergency Department (HOSPITAL_COMMUNITY)
Admission: EM | Admit: 2022-02-21 | Discharge: 2022-02-21 | Disposition: A | Discharge status: Home / Self Care | Payer: Medicaid Other | Attending: Emergency Medicine | Admitting: Emergency Medicine

## 2022-02-21 ENCOUNTER — Encounter (HOSPITAL_COMMUNITY): Payer: Self-pay

## 2022-02-21 ENCOUNTER — Emergency Department (HOSPITAL_COMMUNITY): Payer: Medicaid Other

## 2022-02-21 ENCOUNTER — Other Ambulatory Visit: Payer: Self-pay

## 2022-02-21 DIAGNOSIS — Z3A01 Less than 8 weeks gestation of pregnancy: Secondary | ICD-10-CM | POA: Insufficient documentation

## 2022-02-21 DIAGNOSIS — O200 Threatened abortion: Secondary | ICD-10-CM | POA: Insufficient documentation

## 2022-02-21 DIAGNOSIS — N939 Abnormal uterine and vaginal bleeding, unspecified: Secondary | ICD-10-CM | POA: Diagnosis not present

## 2022-02-21 DIAGNOSIS — O26891 Other specified pregnancy related conditions, first trimester: Secondary | ICD-10-CM | POA: Diagnosis not present

## 2022-02-21 DIAGNOSIS — O039 Complete or unspecified spontaneous abortion without complication: Secondary | ICD-10-CM | POA: Diagnosis not present

## 2022-02-21 DIAGNOSIS — Z3A08 8 weeks gestation of pregnancy: Secondary | ICD-10-CM | POA: Diagnosis not present

## 2022-02-21 DIAGNOSIS — O209 Hemorrhage in early pregnancy, unspecified: Secondary | ICD-10-CM | POA: Diagnosis not present

## 2022-02-21 LAB — COMPREHENSIVE METABOLIC PANEL
ALT: 17 U/L (ref 0–44)
AST: 17 U/L (ref 15–41)
Albumin: 4.7 g/dL (ref 3.5–5.0)
Alkaline Phosphatase: 35 U/L — ABNORMAL LOW (ref 38–126)
Anion gap: 7 (ref 5–15)
BUN: 12 mg/dL (ref 6–20)
CO2: 23 mmol/L (ref 22–32)
Calcium: 9.4 mg/dL (ref 8.9–10.3)
Chloride: 109 mmol/L (ref 98–111)
Creatinine, Ser: 0.55 mg/dL (ref 0.44–1.00)
GFR, Estimated: >60 mL/min (ref >60)
Glucose, Bld: 101 mg/dL — ABNORMAL HIGH (ref 70–99)
Potassium: 4.1 mmol/L (ref 3.5–5.1)
Sodium: 139 mmol/L (ref 135–145)
Total Bilirubin: 0.7 mg/dL (ref 0.3–1.2)
Total Protein: 8 g/dL (ref 6.5–8.1)

## 2022-02-21 LAB — CBC
HCT: 37.2 % (ref 36.0–46.0)
Hemoglobin: 11.9 g/dL — ABNORMAL LOW (ref 12.0–15.0)
MCH: 27.8 pg (ref 26.0–34.0)
MCHC: 32 g/dL (ref 30.0–36.0)
MCV: 86.9 fL (ref 80.0–100.0)
Platelets: 248 10*3/uL (ref 150–400)
RBC: 4.28 MIL/uL (ref 3.87–5.11)
RDW: 14.8 % (ref 11.5–15.5)
WBC: 7.4 10*3/uL (ref 4.0–10.5)
nRBC: 0 % (ref 0.0–0.2)

## 2022-02-21 LAB — HCG, QUANTITATIVE, PREGNANCY: hCG, Beta Chain, Quant, S: 44930 m[IU]/mL — ABNORMAL HIGH (ref <5)

## 2022-02-21 LAB — ABO/RH: ABO/RH(D): O POS

## 2022-02-21 NOTE — ED Triage Notes (Signed)
Pt reports with red/brown vaginal bleeding since 0300 this morning. Pt reports being [redacted] weeks pregnant.
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
## 2022-02-22 ENCOUNTER — Inpatient Hospital Stay (HOSPITAL_COMMUNITY)
Admission: AD | Admit: 2022-02-22 | Discharge: 2022-02-22 | Disposition: A | Discharge status: Home / Self Care | Payer: Medicaid Other | Attending: Obstetrics and Gynecology | Admitting: Obstetrics and Gynecology

## 2022-02-22 ENCOUNTER — Inpatient Hospital Stay (HOSPITAL_COMMUNITY): Payer: Medicaid Other

## 2022-02-22 ENCOUNTER — Encounter (HOSPITAL_COMMUNITY): Payer: Self-pay

## 2022-02-22 DIAGNOSIS — Z3A01 Less than 8 weeks gestation of pregnancy: Secondary | ICD-10-CM

## 2022-02-22 DIAGNOSIS — O039 Complete or unspecified spontaneous abortion without complication: Secondary | ICD-10-CM

## 2022-02-22 DIAGNOSIS — O26891 Other specified pregnancy related conditions, first trimester: Secondary | ICD-10-CM | POA: Diagnosis not present

## 2022-02-22 DIAGNOSIS — O200 Threatened abortion: Secondary | ICD-10-CM | POA: Diagnosis not present

## 2022-02-22 HISTORY — DX: Postpartum depression: F53.0

## 2022-02-22 HISTORY — DX: Personal history of suicidal behavior: Z91.51

## 2022-02-22 HISTORY — DX: Personal history of other specified conditions: Z87.898

## 2022-02-22 LAB — CBC WITH DIFFERENTIAL/PLATELET
Abs Immature Granulocytes: 0.03 10*3/uL (ref 0.00–0.07)
Basophils Absolute: 0 10*3/uL (ref 0.0–0.1)
Basophils Relative: 0 %
Eosinophils Absolute: 0.4 10*3/uL (ref 0.0–0.5)
Eosinophils Relative: 4 %
HCT: 34.5 % — ABNORMAL LOW (ref 36.0–46.0)
Hemoglobin: 11.7 g/dL — ABNORMAL LOW (ref 12.0–15.0)
Immature Granulocytes: 0 %
Lymphocytes Relative: 19 %
Lymphs Abs: 1.8 10*3/uL (ref 0.7–4.0)
MCH: 28.4 pg (ref 26.0–34.0)
MCHC: 33.9 g/dL (ref 30.0–36.0)
MCV: 83.7 fL (ref 80.0–100.0)
Monocytes Absolute: 0.7 10*3/uL (ref 0.1–1.0)
Monocytes Relative: 7 %
Neutro Abs: 6.3 10*3/uL (ref 1.7–7.7)
Neutrophils Relative %: 70 %
Platelets: 249 10*3/uL (ref 150–400)
RBC: 4.12 MIL/uL (ref 3.87–5.11)
RDW: 15 % (ref 11.5–15.5)
WBC: 9.1 10*3/uL (ref 4.0–10.5)
nRBC: 0 % (ref 0.0–0.2)

## 2022-02-22 LAB — HCG, QUANTITATIVE, PREGNANCY: hCG, Beta Chain, Quant, S: 24672 m[IU]/mL — ABNORMAL HIGH (ref <5)

## 2022-02-22 NOTE — MAU Note (Signed)
Discussed at length with patient-presentation and need for further assessment. Pt declined speculum exam and states she wants to have [place] first.

## 2022-02-22 NOTE — MAU Note (Signed)
Pt states vaginal bleeding has remained minimal since arrival. Denies pain

## 2022-02-22 NOTE — MAU Provider Note (Signed)
Chief Complaint:  Abdominal Pain and Vaginal Bleeding   Event Date/Time   First Provider Initiated Contact with Patient 02/22/22 0602     HPI: Sheri Hill is a 31 y.o. [id number] at [redacted]w[redacted]d who presents to maternity admissions reporting heavy vaginal bleeding with increased pain and golf-ball sized clots. Was seen earlier today at [facility] and told she had a "threatened miscarriage" then went home and noted a sharp increase in bleeding/cramping. No other physical complaints.    Pregnancy Course: Receives care at [facility]. Records reviewed - [facility] U/S did not show embryo or cardiac activity.  Past Medical History:  Diagnosis Date   Medical history non-contributory    OB History  Gravida Para Term Preterm AB Living  5 3 3   1 3   SAB IAB Ectopic Multiple Live Births  1     0 3    # Outcome Date GA Lbr Len/2nd Weight Sex Delivery Anes PTL Lv  5 Current           4 SAB 05/01/20 [redacted]w[redacted]d         3 Term 04/14/19 [redacted]w[redacted]d 03:02 / 00:06 6 lb 2.8 oz (2.8 kg) F Vag-Spont None  LIV  2 Term 11/09/17 [redacted]w[redacted]d 09:02 / 00:04 6 lb 10 oz (3.005 kg) F Vag-Spont Local  LIV  1 Term 12/09/15 [redacted]w[redacted]d  6 lb 8 oz (2.948 kg) F Vag-Spont None N LIV   Past Surgical History:  Procedure Laterality Date   NO PAST SURGERIES     Family History  Problem Relation Age of Onset   Cancer Neg Hx    Diabetes Neg Hx    Heart failure Neg Hx    Social History   Tobacco Use   Smoking status: Never   Smokeless tobacco: Never  Vaping Use   Vaping Use: Never used  Substance Use Topics   Alcohol use: No   Drug use: No   No Known Allergies Medications Prior to Admission  Medication Sig Dispense Refill Last Dose   ferrous sulfate 325 (65 FE) MG tablet Take 1 tablet (325 mg total) by mouth daily. (Patient not taking: Reported on 10/10/2020) 30 tablet 3    misoprostol (CYTOTEC) 200 MCG tablet Take 1 tablet (200 mcg total) by mouth in the morning, at noon, and at bedtime for 3 days. (Patient not taking: Reported on  10/10/2020) 9 tablet 0    predniSONE (DELTASONE) 20 MG tablet Take 2 tablets (40 mg total) by mouth daily with breakfast. 10 tablet 0    Prenatal Vit-Fe Fumarate-FA (PRENATAL MULTIVITAMIN) TABS tablet Take 1 tablet by mouth daily at 12 noon.      promethazine (PHENERGAN) 25 MG tablet Take 1 tablet (25 mg total) by mouth every 6 (six) hours as needed for nausea or vomiting. (Patient not taking: Reported on 10/10/2020) 15 tablet 0    sertraline (ZOLOFT) 25 MG tablet Take 1 tablet (25 mg total) by mouth daily. (Patient not taking: Reported on 10/10/2020) 30 tablet 0    tiZANidine (ZANAFLEX) 4 MG tablet Take 1 tablet (4 mg total) by mouth at bedtime. (Patient not taking: Reported on 10/10/2020) 30 tablet 0    I have reviewed patient's Past Medical Hx, Surgical Hx, Family Hx, Social Hx, medications and allergies.   ROS:  Pertinent items noted in HPI and remainder of comprehensive ROS otherwise negative.   Physical Exam  Patient Vitals for the past 24 hrs:  BP Temp Temp src Pulse Resp SpO2 Height Weight  02/22/22 0252 115/69 98 F (36.7 C) Oral 72 17 99 % 5\' 4"  (1.626 m) 98 lb 11.2 oz (44.8 kg)   Constitutional: Well-developed, well-nourished female in no acute distress.  Cardiovascular: normal rate & rhythm, warm and well-perfused Respiratory: normal effort, no problems with respiration noted GI: Abd soft, non-tender, gravid appropriate for gestational age MS: Extremities nontender, no edema, normal ROM Neurologic: Alert and oriented x 4.  GU: no CVA tenderness Pelvic: declined pelvic exam, would allow bleeding assessment - only small amount of bright red blood noted on pad. Did have two large bleeding events in MAU, one in MAU waiting room bathroom and once in MAU room.   Labs: Results for orders placed or performed during the hospital encounter of 02/22/22 (from the past 24 hour(s))  hCG, quantitative, pregnancy     Status: Abnormal   Collection Time: 02/22/22  3:13 AM  Result Value Ref Range    hCG, Beta Chain, Quant, S 24,672 (H) <5 mIU/mL  CBC with Differential/Platelet     Status: Abnormal   Collection Time: 02/22/22  3:15 AM  Result Value Ref Range   WBC 9.1 4.0 - 10.5 K/uL   RBC 4.12 3.87 - 5.11 MIL/uL   Hemoglobin 11.7 (L) 12.0 - 15.0 g/dL   HCT 04/24/22 (L) 39.0 - 30.0 %   MCV 83.7 80.0 - 100.0 fL   MCH 28.4 26.0 - 34.0 pg   MCHC 33.9 30.0 - 36.0 g/dL   RDW 92.3 30.0 - 76.2 %   Platelets 249 150 - 400 K/uL   nRBC 0.0 0.0 - 0.2 %   Neutrophils Relative % 70 %   Neutro Abs 6.3 1.7 - 7.7 K/uL   Lymphocytes Relative 19 %   Lymphs Abs 1.8 0.7 - 4.0 K/uL   Monocytes Relative 7 %   Monocytes Absolute 0.7 0.1 - 1.0 K/uL   Eosinophils Relative 4 %   Eosinophils Absolute 0.4 0.0 - 0.5 K/uL   Basophils Relative 0 %   Basophils Absolute 0.0 0.0 - 0.1 K/uL   Immature Granulocytes 0 %   Abs Immature Granulocytes 0.03 0.00 - 0.07 K/uL   Imaging:  26.3 OB Transvaginal  Result Date: 02/22/2022 CLINICAL DATA:  04/24/2022, with bleeding and threatened abortion. EXAM: OBSTETRIC <14 WK [id number] AND TRANSVAGINAL OB US TECHNIQUE: Both transabdominal and transvaginal ultrasound examinations were performed for complete evaluation of the gestation as well as the maternal uterus, adnexal regions, and pelvic cul-de-sac. Transvaginal technique was performed to assess early pregnancy. COMPARISON:  Yesterday's study at 9:30 a.m. FINDINGS: Intrauterine gestational sac: Single, but it is abnormally low in the uterine cavity and somewhat irregular in shape. Yolk sac:  Visualized. Embryo:  Visualized. Cardiac Activity: Not Visualized. Heart Rate: N/a MSD: 13.0 mm   6 w   1 d +/-10 days CRL:  3.12 mm   5 w   6 d +/-4 days; [place] EDC: 10/18/2022 Subchorionic hemorrhage:  None visualized. Maternal uterus/adnexae: The uterus is retroverted. No wall mass is seen. The cervix is not well seen. There is mild anechoic free fluid in both adnexal spaces. No masslike abnormality is seen in either adnexa. The ovaries are  sonographically unremarkable, normal in size and both demonstrate color flow. IMPRESSION: Six weeks 0 days embryo with no detectable cardiac activity, within a gestational sac which is abnormally low in the uterine cavity and somewhat irregular in shape. Findings are suspicious for but not diagnostic of a failed early pregnancy. Short interval follow-up exam recommended. No subchorionic  bleed. The cervix is not well seen but no funneling is visible. Mild free fluid the adnexal spaces. Electronically Signed   By: Almira Bar M.D.   On: 02/22/2022 04:42   US OB LESS THAN 14 WEEKS WITH OB TRANSVAGINAL  Result Date: 02/21/2022 CLINICAL DATA:  Vaginal bleeding. Quantitative beta HCG 44,930. Estimated gestational age per LMP 8 weeks 5 days. EXAM: OBSTETRIC <14 WK [place] AND TRANSVAGINAL OB US TECHNIQUE: Both transabdominal and transvaginal ultrasound examinations were performed for complete evaluation of the gestation as well as the maternal uterus, adnexal regions, and pelvic cul-de-sac. Transvaginal technique was performed to assess early pregnancy. COMPARISON:  None Available. FINDINGS: Intrauterine gestational sac: Single visualized. Yolk sac:  Visualized. Embryo:  Not visualized. Cardiac Activity: Not visualized. Heart Rate: Not visualized.  Bpm MSD: 12 mm   6 w   0 d Subchorionic hemorrhage:  None visualized. Maternal uterus/adnexae: Ovaries normal size, shape position. Suggestion of 1.7 cm corpus luteum over the right ovary. Small amount of free pelvic fluid. Possible small amount of fluid versus nabothian cyst over the region of the cervix. IMPRESSION: Evidence of intrauterine gestational sac with yolk sac, but no embryo/cardiac activity visualized. Given patient's quantitative beta HCG, findings are likely due to failed pregnancy, although could still represent normal early pregnancy. Recommend serial follow-up quantitative beta HCG and follow-up pelvic ultrasound 10 days. Electronically Signed   By: Elberta Fortis M.D.   On: 02/21/2022 10:38    MAU Course: Orders Placed This Encounter  Procedures   US OB Transvaginal   Urinalysis, Routine w reflex microscopic Urine, Clean Catch   hCG, quantitative, pregnancy   CBC with Differential/Platelet   Discharge patient   No orders of the defined types were placed in this encounter.  MDM: Offered pelvic exam and pain meds to see if gestational sac able to be retrieved, pt declined any kind of pelvic exam. Preferred to confirm miscarriage prior to doing so, declined pain meds.  Sent to U/S Assessment: 1. Miscarriage   2. [redacted] weeks gestation of pregnancy     Plan: Discharge home in stable condition.  ***   Follow-up Information     [facility]. Schedule an appointment as soon as possible for a visit in 1 week(s).   Specialty: Obstetrics and Gynecology Why: One week for repeat bloodwork, then two weeks to discuss future planning Contact information: [address] [place] [address] [phone]                Allergies as of 02/22/2022   No Known Allergies   Med Rec must be completed prior to using this SMARTLINK***       Edd Arbour, CNM, MSN, IBCLC Certified Nurse Midwife, [facility]

## 2022-02-22 NOTE — MAU Note (Addendum)
.  Sheri Hill is a 31 y.o. at [redacted]w[redacted]d here in MAU reporting: bright red bleeding with blood clots x2 that were the size of a golf ball, started around 0000 along with lower abdominal cramping that comes and goes. Had [place] yesterday was told was a threatened miscarriage. Denies taking any medication for the cramping. Reports last intercourse was yesterday 9/8. States needing to use 2 pads in the last hour and the pads were saturated front to back.   Onset of complaint: 0000 Pain score: 5 Vitals:   02/22/22 0252  BP: 115/69  Pulse: 72  Resp: 17  Temp: 98 F (36.7 C)  SpO2: 99%     FHT:NA  Lab orders placed from triage:  UA

## 2022-02-23 ENCOUNTER — Other Ambulatory Visit: Payer: Self-pay | Admitting: Adult Health

## 2022-02-23 DIAGNOSIS — O039 Complete or unspecified spontaneous abortion without complication: Secondary | ICD-10-CM

## 2022-02-23 NOTE — Progress Notes (Signed)
Ck QHCG in 1 week appt in 2 weeks sp miscarriage

## 2022-03-02 ENCOUNTER — Other Ambulatory Visit: Payer: Medicaid Other

## 2022-03-02 DIAGNOSIS — O039 Complete or unspecified spontaneous abortion without complication: Secondary | ICD-10-CM | POA: Diagnosis not present

## 2022-03-03 LAB — BETA HCG QUANT (REF LAB): hCG Quant: 495 m[IU]/mL

## 2022-03-09 ENCOUNTER — Ambulatory Visit: Payer: Medicaid Other | Admitting: Adult Health

## 2022-03-09 ENCOUNTER — Encounter: Payer: Self-pay | Admitting: Adult Health

## 2022-03-09 ENCOUNTER — Other Ambulatory Visit (HOSPITAL_COMMUNITY)
Admission: RE | Admit: 2022-03-09 | Discharge: 2022-03-09 | Disposition: A | Discharge status: Home / Self Care | Payer: Medicaid Other | Source: Ambulatory Visit | Attending: Adult Health | Admitting: Adult Health

## 2022-03-09 VITALS — BP 117/71 | HR 78 | Ht 64.0 in | Wt 100.5 lb

## 2022-03-09 DIAGNOSIS — Z124 Encounter for screening for malignant neoplasm of cervix: Secondary | ICD-10-CM | POA: Diagnosis not present

## 2022-03-09 DIAGNOSIS — Z319 Encounter for procreative management, unspecified: Secondary | ICD-10-CM | POA: Insufficient documentation

## 2022-03-09 DIAGNOSIS — O039 Complete or unspecified spontaneous abortion without complication: Secondary | ICD-10-CM | POA: Diagnosis not present

## 2022-03-09 NOTE — Progress Notes (Signed)
  Subjective:     Patient ID: Sheri Hill, female   DOB: 24-Jun-1990, 31 y.o.   MRN: [id number]  HPI [name] is a 31 year old white female,married, [id number], back in follow up on miscarriage, blood type is O+, QHCG was 44,930 on 02/21/22 and dropped to 24,672 on 02/22/22 and then on 03/02/22 was 495. Her GB was 11.7 on 02/22/22, just spotting some now, no pain She needs a pap.   Review of Systems Spotting some,no pain  Reviewed past medical,surgical, social and family history. Reviewed medications and allergies.     Objective:   Physical Exam BP 117/71 (BP Location: Left Arm, Patient Position: Sitting, Cuff Size: Normal)   Pulse 78   Ht 5\' 4"  (1.626 m)   Wt 100 lb 8 oz (45.6 kg)   Breastfeeding No   BMI 17.25 kg/m     Skin warm and dry.Pelvic: external genitalia is normal in appearance no lesions, vagina: pink,urethra has no lesions or masses noted, cervix: bulbous,pap with HR HPV genotyping performed, uterus: normal size, shape and contour, non tender, no masses felt, adnexa: no masses or tenderness noted. Bladder is non tender and no masses felt.    03/09/2022    9:41 AM 07/26/2017    1:52 PM  Depression screen PHQ 2/9  Decreased Interest 0 0  Down, Depressed, Hopeless 0 0  PHQ - 2 Score 0 0  Altered sleeping 0 0  Tired, decreased energy 0 0  Change in appetite 0 0  Feeling bad or failure about yourself  0 0  Trouble concentrating 0 0  Moving slowly or fidgety/restless 0 0  Suicidal thoughts 0 0  PHQ-9 Score 0 0  Difficult doing work/chores  Not difficult at all       03/09/2022    9:41 AM  GAD 7 : Generalized Anxiety Score  Nervous, Anxious, on Edge 0  Control/stop worrying 0  Worry too much - different things 0  Trouble relaxing 0  Restless 0  Easily annoyed or irritable 0  Afraid - awful might happen 0  Total GAD 7 Score 0      Upstream - 03/09/22 0935       Pregnancy Intention Screening   Does the patient want to become pregnant in the next year? Yes    Does the  patient's partner want to become pregnant in the next year? Yes    Would the patient like to discuss contraceptive options today? No      Contraception Wrap Up   Current Method Pregnant/Seeking Pregnancy    End Method Pregnant/Seeking Pregnancy            Examination chaperoned by Levy Pupa LPN  Assessment:     1. Routine cervical smear Pap sent  Pap in 3 years if normal   2. Miscarriage Just spotting a little Check QHCG   3. Patient desires pregnancy Continue PNV   Can start trying with next period   Plan:     Follow up prn

## 2022-03-10 ENCOUNTER — Other Ambulatory Visit: Payer: Self-pay | Admitting: Adult Health

## 2022-03-10 DIAGNOSIS — O039 Complete or unspecified spontaneous abortion without complication: Secondary | ICD-10-CM

## 2022-03-10 LAB — BETA HCG QUANT (REF LAB): hCG Quant: 78 m[IU]/mL

## 2022-03-12 LAB — CYTOLOGY - PAP
Comment: NEGATIVE
Diagnosis: NEGATIVE
High risk HPV: NEGATIVE

## 2022-03-23 DIAGNOSIS — O039 Complete or unspecified spontaneous abortion without complication: Secondary | ICD-10-CM | POA: Diagnosis not present

## 2022-03-24 ENCOUNTER — Other Ambulatory Visit: Payer: Self-pay | Admitting: Adult Health

## 2022-03-24 DIAGNOSIS — O039 Complete or unspecified spontaneous abortion without complication: Secondary | ICD-10-CM

## 2022-03-24 LAB — BETA HCG QUANT (REF LAB): hCG Quant: 9 m[IU]/mL

## 2022-04-06 DIAGNOSIS — O039 Complete or unspecified spontaneous abortion without complication: Secondary | ICD-10-CM | POA: Diagnosis not present

## 2022-04-07 ENCOUNTER — Emergency Department (HOSPITAL_COMMUNITY)
Admission: EM | Admit: 2022-04-07 | Discharge: 2022-04-08 | Disposition: A | Discharge status: Home / Self Care | Payer: Medicaid Other | Attending: Emergency Medicine | Admitting: Emergency Medicine

## 2022-04-07 ENCOUNTER — Encounter (HOSPITAL_COMMUNITY): Payer: Self-pay

## 2022-04-07 ENCOUNTER — Other Ambulatory Visit: Payer: Self-pay

## 2022-04-07 DIAGNOSIS — F32A Depression, unspecified: Secondary | ICD-10-CM | POA: Insufficient documentation

## 2022-04-07 DIAGNOSIS — R45851 Suicidal ideations: Secondary | ICD-10-CM | POA: Diagnosis not present

## 2022-04-07 DIAGNOSIS — Z3A01 Less than 8 weeks gestation of pregnancy: Secondary | ICD-10-CM | POA: Diagnosis not present

## 2022-04-07 LAB — ACETAMINOPHEN LEVEL: Acetaminophen (Tylenol), Serum: <10 ug/mL — ABNORMAL LOW (ref 10–30)

## 2022-04-07 LAB — RAPID URINE DRUG SCREEN, HOSP PERFORMED
Amphetamines: NOT DETECTED
Barbiturates: NOT DETECTED
Benzodiazepines: NOT DETECTED
Cocaine: NOT DETECTED
Opiates: NOT DETECTED
Tetrahydrocannabinol: NOT DETECTED

## 2022-04-07 LAB — COMPREHENSIVE METABOLIC PANEL
ALT: 15 U/L (ref 0–44)
AST: 14 U/L — ABNORMAL LOW (ref 15–41)
Albumin: 4.3 g/dL (ref 3.5–5.0)
Alkaline Phosphatase: 36 U/L — ABNORMAL LOW (ref 38–126)
Anion gap: 7 (ref 5–15)
BUN: 16 mg/dL (ref 6–20)
CO2: 24 mmol/L (ref 22–32)
Calcium: 9 mg/dL (ref 8.9–10.3)
Chloride: 107 mmol/L (ref 98–111)
Creatinine, Ser: 0.67 mg/dL (ref 0.44–1.00)
GFR, Estimated: >60 mL/min (ref >60)
Glucose, Bld: 109 mg/dL — ABNORMAL HIGH (ref 70–99)
Potassium: 3.7 mmol/L (ref 3.5–5.1)
Sodium: 138 mmol/L (ref 135–145)
Total Bilirubin: 0.4 mg/dL (ref 0.3–1.2)
Total Protein: 7.6 g/dL (ref 6.5–8.1)

## 2022-04-07 LAB — SALICYLATE LEVEL: Salicylate Lvl: <7 mg/dL — ABNORMAL LOW (ref 7.0–30.0)

## 2022-04-07 LAB — CBC
HCT: 33.7 % — ABNORMAL LOW (ref 36.0–46.0)
Hemoglobin: 10.8 g/dL — ABNORMAL LOW (ref 12.0–15.0)
MCH: 26.7 pg (ref 26.0–34.0)
MCHC: 32 g/dL (ref 30.0–36.0)
MCV: 83.4 fL (ref 80.0–100.0)
Platelets: 312 10*3/uL (ref 150–400)
RBC: 4.04 MIL/uL (ref 3.87–5.11)
RDW: 13 % (ref 11.5–15.5)
WBC: 8.7 10*3/uL (ref 4.0–10.5)
nRBC: 0 % (ref 0.0–0.2)

## 2022-04-07 LAB — BETA HCG QUANT (REF LAB): hCG Quant: 2 m[IU]/mL

## 2022-04-07 LAB — ETHANOL: Alcohol, Ethyl (B): <10 mg/dL (ref <10)

## 2022-04-07 LAB — POC URINE PREG, ED: Preg Test, Ur: NEGATIVE

## 2022-04-07 MED ORDER — ACETAMINOPHEN 325 MG PO TABS: 650.0000 mg | ORAL_TABLET | ORAL | Status: DC | PRN

## 2022-04-07 MED ORDER — ONDANSETRON HCL 4 MG PO TABS: 4.0000 mg | ORAL_TABLET | Freq: Three times a day (TID) | ORAL | Status: DC | PRN

## 2022-04-07 NOTE — ED Notes (Signed)
Pt TSS in process

## 2022-04-07 NOTE — ED Triage Notes (Addendum)
Pt presents with suicidal thoughts that have been going on for "a long time". Pt denies SI at this moment but states she wants help. Pt states that she has had 2 miscarriages with the most recent being a month ago. Pt states that she does not have a plan. Pt husband called for pt to be brought in due to her expressing SI.

## 2022-04-07 NOTE — ED Provider Notes (Signed)
[facility] EMERGENCY DEPARTMENT Provider Note   CSN: [id number] Arrival date & time: 04/07/22  1918     History  Chief Complaint  Patient presents with   Suicidal    Sheri Hill is a 31 y.o. female presenting to Emergency Department with depression and suicidal ideations after miscarriage.  The patient reports she had a miscarriage 1 month ago.  She says she has been extremely tearful, depressed.  She expresses side of her husband encouraged her to come to the emergency department.  She denies an active plan, and reports no prior history of harming herself or suicide attempts.  She does live at home with her husband and her 3 daughters.  She denies any drug or alcohol use in the past 24 hours.  She is here voluntarily.  HPI     Home Medications Prior to Admission medications   Not on File      Allergies    Patient has no known allergies.    Review of Systems   Review of Systems  Physical Exam Updated Vital Signs BP 121/67   Pulse 81   Temp 97.7 F (36.5 C) (Oral)   Resp 20   Ht 5\' 4"  (1.626 m)   Wt 45.4 kg   SpO2 100%   BMI 17.16 kg/m  Physical Exam Constitutional:      General: She is not in acute distress. HENT:     Head: Normocephalic and atraumatic.  Eyes:     Conjunctiva/sclera: Conjunctivae normal.     Pupils: Pupils are equal, round, and reactive to light.  Cardiovascular:     Rate and Rhythm: Normal rate and regular rhythm.  Pulmonary:     Effort: Pulmonary effort is normal. No respiratory distress.  Skin:    General: Skin is warm and dry.  Neurological:     General: No focal deficit present.     Mental Status: She is alert. Mental status is at baseline.  Psychiatric:        Mood and Affect: Mood normal.        Behavior: Behavior normal.        Thought Content: Thought content normal.        Judgment: Judgment normal.     Comments: Tearful affect,     ED Results / Procedures / Treatments   Labs (all labs ordered are listed, but only  abnormal results are displayed) Labs Reviewed  COMPREHENSIVE METABOLIC PANEL - Abnormal; Notable for the following components:      Result Value   Glucose, Bld 109 (*)    AST 14 (*)    Alkaline Phosphatase 36 (*)    All other components within normal limits  SALICYLATE LEVEL - Abnormal; Notable for the following components:   Salicylate Lvl [id number] (*)    All other components within normal limits  ACETAMINOPHEN LEVEL - Abnormal; Notable for the following components:   Acetaminophen (Tylenol), Serum <10 (*)    All other components within normal limits  CBC - Abnormal; Notable for the following components:   Hemoglobin 10.8 (*)    HCT 33.7 (*)    All other components within normal limits  ETHANOL  RAPID URINE DRUG SCREEN, HOSP PERFORMED  POC URINE PREG, ED    EKG None  Radiology No results found.  Procedures Procedures    Medications Ordered in ED Medications - No data to display  ED Course/ Medical Decision Making/ A&P  Medical Decision Making Amount and/or Complexity of Data Reviewed Labs: ordered.   This patient presents to the Emergency Department with complaint of psychiatric disturbance. This involves an extensive number of treatment options, and is a complaint that carries with it a high risk of complications and morbidity.   I ordered, reviewed, and interpreted labs, including BMP and CBC.  There were no immediate, life-threatening emergencies found in this labwork.  The patient was medically cleared for TTS and psychiatric evaluation.  At this time, the patient is NOT under IVC. She remains here voluntarily.          Final Clinical Impression(s) / ED Diagnoses Final diagnoses:  Suicidal ideation    Rx / DC Orders ED Discharge Orders     None         Sophie Tamez, Carola Rhine, MD 04/07/22 2231

## 2022-04-07 NOTE — BH Assessment (Signed)
Comprehensive Clinical Assessment (CCA) Note  04/07/2022 Sheri Hill [id number]  Disposition: Sheri Georges, NP recommends pt to be observed and reassessed by psychiatry. Clinician discussed disposition with Sheri M. Brame, RN via secure chat.   [name] from 04/07/2022 in [place] ED from 02/21/2022 in [place] DEPT ED from 10/09/2020 in [place] High Risk No Risk No Risk      The patient demonstrates the following risk factors for suicide: Chronic risk factors for suicide include: psychiatric disorder of Major Depressive Disorder and previous suicide attempts Pt reports, she overdosed on mushrooms as a suicide attempt 2-3 years ago . Acute risk factors for suicide include:  Pt's recent miscarriage a month ago . Protective factors for this patient include: positive social support. Considering these factors, the overall suicide risk at this point appears to be high. Patient is not appropriate for outpatient follow up.  Sheri Hill is a 31 year old female who presents voluntary and unaccompanied to [place]. Clinician asked the pt, "what brought you to the hospital?" Pt reports, she had a miscarriage at five weeks a month ago; she had thoughts of hurting herself off and on today. Pt denies, plan, intent and means. Pt reports, she told her husband how she was feeling, he called the police who brought her to the hospital. Pt denies, SI, HI, AVH, self-injurious behaviors and access to weapons.   Pt denies, substance use. Pt's UDS is negative. Pt denies, being linked to OPT resources (medication management and/or counseling.)  Pt reports, 2-3 years ago she attempted suicide by overdosing on mushrooms. Per chart, she was admitted to [facility] from 02/13/2020-02/16/2020 for a suicide attempt.   Pt presents pleasant, tearful in scrubs with normal speech. Pt's mood was sad. Pt's affect  was congruent. Pt's insight was fair. Pt's judgement was poor. Clinician discussed the three possible dispositions (discharged with OPT resources, observe/reassess by psychiatry or inpatient treatment) in detail. Pt reports, she can contact for safety if discharged.   Diagnosis: Major Depressive Disorder.   *Pt consented for clinician to contact her husband Sheri Hill, [phone]) to obtain additional information however there was no answer. Clinician did not leave a message due to pt's husband having an unidentifiable voicemail.*  Chief Complaint:  Chief Complaint  Patient presents with   Suicidal   Visit Diagnosis:     CCA Screening, Triage and Referral (STR)  Patient Reported Information How did you hear about [place]? Other (Comment) (Per pt, her husband called the police to bring the pt to the hospital.)  What Is the Reason for Your Visit/Call Today? Per EDP note: "is a 31 y.o. female presenting to Emergency Department with depression and suicidal ideations after miscarriage. The patient reports she had a miscarriage 1 month ago. She says she has been extremely tearful, depressed. She expresses side of her husband encouraged her to come to the emergency department. She denies an active plan, and reports no prior history of harming herself or suicide attempts. She does live at home with her husband and her 3 daughters. She denies any drug or alcohol use in the past 24 hours. She is here voluntarily."  How Long Has This Been Causing You Problems? <Week  What Do You Feel Would Help You the Most Today? Treatment for Depression or other mood problem; Stress Management   Have You Recently Had Any Thoughts About Hurting Yourself? Yes  Are You Planning  to Commit Suicide/Harm Yourself At This time? No   Have you Recently Had Thoughts About Hurting Someone Sheri Hill? No  Are You Planning to Harm Someone at This Time? No  Explanation: No data recorded  Have You Used Any Alcohol or Drugs in  the Past 24 Hours? No  How Long Ago Did You Use Drugs or Alcohol? No data recorded What Did You Use and How Much? No data recorded  Do You Currently Have a Therapist/Psychiatrist? No data recorded Name of Therapist/Psychiatrist: No data recorded  Have You Been Recently Discharged From Any Office Practice or Programs? No  Explanation of Discharge From Practice/Program: No data recorded    CCA Screening Triage Referral Assessment Type of Contact: Tele-Assessment  Telemedicine Service Delivery: Telemedicine service delivery: This service was provided via telemedicine using a 2-way, interactive audio and video technology  Is this Initial or Reassessment? Initial Assessment  Date Telepsych consult ordered in [facility]:  04/07/22  Time Telepsych consult ordered in [facility]:  2035  Location of Assessment: [facility] ED  Provider Location: [facility] Assessment Services   Collateral Involvement: Sheri Hill, husband, [phone]. Pt consented for clinician to contact her husband to obtain additional information however there was no answer. Clinician did not leave a message due to pt's husband having an unidentifiable voicemail,   Does Patient Have a Automotive engineer Guardian? No  Legal Guardian Contact Information: No data recorded Copy of Legal Guardianship Form: No data recorded Legal Guardian Notified of Arrival: No data recorded Legal Guardian Notified of Pending Discharge: No data recorded If Minor and Not Living with Parent(s), Who has Custody? No data recorded Is CPS involved or ever been involved? Never  Is APS involved or ever been involved? Never   Patient Determined To Be At Risk for Harm To Self or Others Based on Review of Patient Reported Information or Presenting Complaint? Yes, for Self-Harm  Method: No data recorded Availability of Means: No data recorded Intent: No data recorded Notification Required: No data recorded Additional Information for Danger to Others Potential:  No data recorded Additional Comments for Danger to Others Potential: No data recorded Are There Guns or Other Weapons in Your Home? No data recorded Types of Guns/Weapons: No data recorded Are These Weapons Safely Secured?                            No data recorded Who Could Verify You Are Able To Have These Secured: No data recorded Do You Have any Outstanding Charges, Pending Court Dates, Parole/Probation? No data recorded Contacted To Inform of Risk of Harm To Self or Others: Family/Significant Other:    Does Patient Present under Involuntary Commitment? No  IVC Papers Initial File Date: No data recorded  [place] of Residence: [place]   Patient Currently Receiving the Following Services: Not Receiving Services   Determination of Need: Urgent (48 hours)   Options For Referral: Inpatient Hospitalization; Outpatient Therapy; Medication Management     CCA Biopsychosocial Patient Reported Schizophrenia/Schizoaffective Diagnosis in Past: No   Strengths: Pt has supportive husband.   Mental Health Symptoms Depression:   Tearfulness   Duration of Depressive symptoms:    Mania:   None   Anxiety:    None   Psychosis:   None   Duration of Psychotic symptoms:    Trauma:   None   Obsessions:   None   Compulsions:   None   Inattention:   None   Hyperactivity/Impulsivity:  None   Oppositional/Defiant Behaviors:   None   Emotional Irregularity:  No data recorded  Other Mood/Personality Symptoms:  No data recorded   Mental Status Exam Appearance and self-care  Stature:   Average   Weight:   Average weight   Clothing:   -- (Pt in scrubs.)   Grooming:   Normal   Cosmetic use:   None   Posture/gait:   Normal   Motor activity:   Not Remarkable   Sensorium  Attention:   Normal   Concentration:   Normal   Orientation:   X5   Recall/memory:  Normal   Affect and Mood  Affect:  Congruent   Mood:  Other (Comment) (Sad.)    Relating  Eye contact:   Normal   Facial expression:   Sad   Attitude toward examiner:   Cooperative   Thought and Language  Speech flow:  Normal   Thought content:   Appropriate to Mood and Circumstances   Preoccupation:   None   Hallucinations:   None   Organization:   Coherent   [employer] of Knowledge:   Fair   Intelligence:   Average   Abstraction:   Functional   Judgement:  Poor   Reality Testing:   Realistic   Insight:   Fair   Decision Making:   Normal   Social Functioning  Social Maturity:  No data recorded  Social Judgement:   Normal   Stress  Stressors:   Grief/losses   Coping Ability:   Human resources officer Deficits:   Communication   Supports:   Family     Religion: Religion/Spirituality Are You A Religious Person?: Yes What is Your Religious Affiliation?: Environmental consultant: Leisure / Recreation Do You Have Hobbies?: Yes Leisure and Hobbies: Cooking.  Exercise/Diet: Exercise/Diet Do You Exercise?: No Do You Follow a Special Diet?: No Do You Have Any Trouble Sleeping?: No   CCA Employment/Education Employment/Work Situation: Employment / Work Situation Employment Situation: Unemployed Patient's Job has Been Impacted by Current Illness: No  Education: Education Is Patient Currently Attending School?: No Last Grade Completed: 12 Did You Product manager?: No Did You Have An Individualized Education Program (IIEP): No Did You Have Any Difficulty At [employer]?: No Patient's Education Has Been Impacted by Current Illness: No   CCA Family/Childhood History Family and Relationship History: Family history Marital status: Married Number of Years Married: 7 What types of issues is patient dealing with in the relationship?: UTA Additional relationship information: UTA Does patient have children?: Yes How many children?: 3 How is patient's relationship with their children?: Pt reports,  she has three daughters (6, 4, 2).  Childhood History:  Childhood History By whom was/is the patient raised?: Other (Comment) (UTA) Did patient suffer any verbal/emotional/physical/sexual abuse as a child?: No Did patient suffer from severe childhood neglect?: No Has patient ever been sexually abused/assaulted/raped as an adolescent or adult?: No Was the patient ever a victim of a crime or a disaster?: No Witnessed domestic violence?: No Has patient been affected by domestic violence as an adult?:  (NA)  Child/Adolescent Assessment:     CCA Substance Use Alcohol/Drug Use: Alcohol / Drug Use Pain Medications: See MAR Prescriptions: See MAR Over the Counter: See MAR History of alcohol / drug use?: No history of alcohol / drug abuse (Pt denies.)    ASAM's:  Six Dimensions of Multidimensional Assessment  Dimension 1:  Acute Intoxication and/or Withdrawal Potential:      Dimension  2:  Biomedical Conditions and Complications:      Dimension 3:  Emotional, Behavioral, or Cognitive Conditions and Complications:     Dimension 4:  Readiness to Change:     Dimension 5:  Relapse, Continued use, or Continued Problem Potential:     Dimension 6:  Recovery/Living Environment:     ASAM Severity Score:    ASAM Recommended Level of Treatment:     Substance use Disorder (SUD)    Recommendations for Services/Supports/Treatments: Recommendations for Services/Supports/Treatments Recommendations For Services/Supports/Treatments: Other (Comment) (Pt to be observed and reassessed by psychiatry.)  Discharge Disposition: Discharge Disposition Medical Exam completed: Yes  DSM5 Diagnoses: Patient Active Problem List   Diagnosis Date Noted   Patient desires pregnancy 03/09/2022   Miscarriage 03/09/2022   Routine cervical smear 03/09/2022   Missed abortion 05/01/2020   Major depression 02/16/2020   Suicide attempt (HCC) 02/13/2020   [redacted] weeks gestation of pregnancy 04/16/2019   Limited  prenatal care 04/14/2019   Oligohydramnios 04/14/2019   Antenatal screening for malformation using ultrasonics 01/11/2019   [redacted] weeks gestation of pregnancy 01/11/2019   Umbilical hernia 08/23/2017     Referrals to Alternative Service(s): Referred to Alternative Service(s):   Place:   Date:   Time:    Referred to Alternative Service(s):   Place:   Date:   Time:    Referred to Alternative Service(s):   Place:   Date:   Time:    Referred to Alternative Service(s):   Place:   Date:   Time:     Redmond Pulling, [facility] Comprehensive Clinical Assessment (CCA) Screening, Triage and Referral Note  04/07/2022 Sheri Hill [id number]  Chief Complaint:  Chief Complaint  Patient presents with   Suicidal   Visit Diagnosis:   Patient Reported Information How did you hear about [place]? Other (Comment) (Per pt, her husband called the police to bring the pt to the hospital.)  What Is the Reason for Your Visit/Call Today? Per EDP note: "is a 31 y.o. female presenting to Emergency Department with depression and suicidal ideations after miscarriage. The patient reports she had a miscarriage 1 month ago. She says she has been extremely tearful, depressed. She expresses side of her husband encouraged her to come to the emergency department. She denies an active plan, and reports no prior history of harming herself or suicide attempts. She does live at home with her husband and her 3 daughters. She denies any drug or alcohol use in the past 24 hours. She is here voluntarily."  How Long Has This Been Causing You Problems? <Week  What Do You Feel Would Help You the Most Today? Treatment for Depression or other mood problem; Stress Management   Have You Recently Had Any Thoughts About Hurting Yourself? Yes  Are You Planning to Commit Suicide/Harm Yourself At This time? No   Have you Recently Had Thoughts About Hurting Someone Sheri Hill? No  Are You Planning to Harm Someone at This Time? No  Explanation:  No data recorded  Have You Used Any Alcohol or Drugs in the Past 24 Hours? No  How Long Ago Did You Use Drugs or Alcohol? No data recorded What Did You Use and How Much? No data recorded  Do You Currently Have a Therapist/Psychiatrist? No data recorded Name of Therapist/Psychiatrist: No data recorded  Have You Been Recently Discharged From Any Office Practice or Programs? No  Explanation of Discharge From Practice/Program: No data recorded   CCA Screening Triage Referral Assessment Type of  Contact: Tele-Assessment  Telemedicine Service Delivery: Telemedicine service delivery: This service was provided via telemedicine using a 2-way, interactive audio and video technology  Is this Initial or Reassessment? Initial Assessment  Date Telepsych consult ordered in [facility]:  04/07/22  [facility]  Location of Assessment: [facility]  [facility]   Collateral Involvement: Rebeca AlertDavid Sharrar, husband, [phone]. Pt consented for clinician to contact her husband to obtain additional information however there was no answer. Clinician did not leave a message due to pt's husband having an unidentifiable voicemail,   Does Patient Have a Automotive engineerCourt Appointed Legal Guardian? No data recorded Name and Contact of Legal Guardian: No data recorded If Minor and Not Living with Parent(s), Who has Custody? No data recorded Is CPS involved or ever been involved? Never  Is APS involved or ever been involved? Never   Patient Determined To Be At Risk for Harm To Self or Others Based on Review of Patient Reported Information or Presenting Complaint? Yes, for Self-Harm  Method: No data recorded Availability of Means: No data recorded Intent: No data recorded Notification Required: No data recorded Additional Information for Danger to Others Potential: No data recorded Additional Comments for Danger to Others Potential: No data recorded Are There  Guns or Other Weapons in Your Home? No data recorded Types of Guns/Weapons: No data recorded Are These Weapons Safely Secured?                            No data recorded Who Could Verify You Are Able To Have These Secured: No data recorded Do You Have any Outstanding Charges, Pending Court Dates, Parole/Probation? No data recorded Contacted To Inform of Risk of Harm To Self or Others: Family/Significant Other:   Does Patient Present under Involuntary Commitment? No  IVC Papers Initial File Date: No data recorded  [place]   Patient Currently Receiving the Following Services: Not Receiving Services   Determination of Need: Urgent (48 hours)   Options For Referral: Inpatient Hospitalization; Outpatient Therapy; Medication Management   Discharge Disposition:  Discharge Disposition Medical Exam completed: Yes  Redmond Pullingreylese D Bj Morlock, [facility]     Redmond Pullingreylese D Demond Shallenberger, MS, [facility], [facility] Triage Specialist [phone]

## 2022-04-07 NOTE — BH Assessment (Signed)
Clinician to assess pt after EDP completes their assessment on pt. Clinician asked [name], RN to inform her after EDP sees the pt. TTS to then complete pt's assessment.     Vertell Novak, [place], [facility], [facility] Triage Specialist [phone]

## 2022-04-07 NOTE — BH Assessment (Signed)
Clinician messaged [name], RN: "Hey. It's Trey with TTS. Is the pt able to engage in the assessment, if so the pt will need to be placed in a private room. Is the pt under IVC? Also is the pt medically cleared?"    Clinician awaiting response.    Vertell Novak, [place], [facility], [facility] Triage Specialist [phone]

## 2022-04-08 DIAGNOSIS — F32A Depression, unspecified: Secondary | ICD-10-CM

## 2022-04-08 DIAGNOSIS — R45851 Suicidal ideations: Secondary | ICD-10-CM | POA: Diagnosis not present

## 2022-04-08 NOTE — ED Notes (Signed)
Pt being discharged. Provided pt with her personal items.

## 2022-04-08 NOTE — ED Provider Notes (Signed)
Care patient received from prior provider at 0 700.  In short, patient has had suicidal ideations after recent psychosocial stressors.  She is to be evaluated psychiatry today. Psychiatry evaluated patient and recommends outpatient care and management.  Given overall well appearance at this time, I believe this is reasonable.  Patient discharged with no further acute events with strict return precautions.   Tretha Sciara, MD 04/08/22 1346

## 2022-04-08 NOTE — ED Notes (Signed)
Pt is awake and eating breakfast at this time.
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
## 2022-04-08 NOTE — ED Notes (Signed)
TTS at this time. 

## 2023-11-02 ENCOUNTER — Encounter (HOSPITAL_COMMUNITY): Payer: Self-pay | Admitting: *Deleted

## 2023-11-02 ENCOUNTER — Other Ambulatory Visit: Payer: Self-pay

## 2023-11-02 ENCOUNTER — Emergency Department (HOSPITAL_COMMUNITY)
Admission: EM | Admit: 2023-11-02 | Discharge: 2023-11-03 | Disposition: A | Discharge status: Home / Self Care | Attending: Emergency Medicine | Admitting: Emergency Medicine

## 2023-11-02 DIAGNOSIS — O3680X0 Pregnancy with inconclusive fetal viability, not applicable or unspecified: Secondary | ICD-10-CM | POA: Diagnosis not present

## 2023-11-02 DIAGNOSIS — N854 Malposition of uterus: Secondary | ICD-10-CM | POA: Diagnosis not present

## 2023-11-02 DIAGNOSIS — O039 Complete or unspecified spontaneous abortion without complication: Secondary | ICD-10-CM | POA: Diagnosis not present

## 2023-11-02 DIAGNOSIS — O209 Hemorrhage in early pregnancy, unspecified: Secondary | ICD-10-CM | POA: Diagnosis not present

## 2023-11-02 DIAGNOSIS — O3481 Maternal care for other abnormalities of pelvic organs, first trimester: Secondary | ICD-10-CM | POA: Diagnosis not present

## 2023-11-02 LAB — CBC WITH DIFFERENTIAL/PLATELET
Abs Immature Granulocytes: 0.01 10*3/uL (ref 0.00–0.07)
Basophils Absolute: 0.1 10*3/uL (ref 0.0–0.1)
Basophils Relative: 1 %
Eosinophils Absolute: 0.6 10*3/uL — ABNORMAL HIGH (ref 0.0–0.5)
Eosinophils Relative: 9 %
HCT: 37.5 % (ref 36.0–46.0)
Hemoglobin: 11.8 g/dL — ABNORMAL LOW (ref 12.0–15.0)
Immature Granulocytes: 0 %
Lymphocytes Relative: 25 %
Lymphs Abs: 1.8 10*3/uL (ref 0.7–4.0)
MCH: 25.9 pg — ABNORMAL LOW (ref 26.0–34.0)
MCHC: 31.5 g/dL (ref 30.0–36.0)
MCV: 82.4 fL (ref 80.0–100.0)
Monocytes Absolute: 0.5 10*3/uL (ref 0.1–1.0)
Monocytes Relative: 8 %
Neutro Abs: 4.1 10*3/uL (ref 1.7–7.7)
Neutrophils Relative %: 57 %
Platelets: 235 10*3/uL (ref 150–400)
RBC: 4.55 MIL/uL (ref 3.87–5.11)
RDW: 18.5 % — ABNORMAL HIGH (ref 11.5–15.5)
WBC: 7 10*3/uL (ref 4.0–10.5)
nRBC: 0 % (ref 0.0–0.2)

## 2023-11-02 LAB — HCG, QUANTITATIVE, PREGNANCY: hCG, Beta Chain, Quant, S: 2030 m[IU]/mL — ABNORMAL HIGH (ref <5)

## 2023-11-02 NOTE — ED Notes (Signed)
 Pt given scrub pants, mesh panties, and pads to change into per pt request.

## 2023-11-02 NOTE — ED Triage Notes (Signed)
 Pt with vaginal bleeding ( brown in color) that started today. Pt states she is pregnant. Denies any clots, denies pain.
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
## 2023-11-02 NOTE — ED Notes (Signed)
 ED Provider at bedside.

## 2023-11-03 ENCOUNTER — Emergency Department (HOSPITAL_COMMUNITY)

## 2023-11-03 DIAGNOSIS — N854 Malposition of uterus: Secondary | ICD-10-CM | POA: Diagnosis not present

## 2023-11-03 DIAGNOSIS — O3680X0 Pregnancy with inconclusive fetal viability, not applicable or unspecified: Secondary | ICD-10-CM | POA: Diagnosis not present

## 2023-11-03 DIAGNOSIS — O3481 Maternal care for other abnormalities of pelvic organs, first trimester: Secondary | ICD-10-CM | POA: Diagnosis not present

## 2023-11-03 DIAGNOSIS — O209 Hemorrhage in early pregnancy, unspecified: Secondary | ICD-10-CM | POA: Diagnosis not present

## 2023-11-03 NOTE — Discharge Instructions (Signed)
 You were evaluated in the Emergency Department and after careful evaluation, we did not find any emergent condition requiring admission or further testing in the hospital.  Symptoms likely due to a miscarriage.  Recommend follow-up with your OB/GYN for continued management.  Please return to the Emergency Department if you experience any worsening of your condition.   Thank you for allowing us  to be a part of your care.

## 2023-11-03 NOTE — ED Notes (Signed)
 Patient transported to Ultrasound

## 2023-11-03 NOTE — ED Notes (Signed)
 ED Provider at bedside.

## 2024-01-02 ENCOUNTER — Other Ambulatory Visit: Payer: Self-pay

## 2024-01-02 ENCOUNTER — Emergency Department (HOSPITAL_COMMUNITY)
Admission: EM | Admit: 2024-01-02 | Discharge: 2024-01-02 | Disposition: A | Discharge status: Home / Self Care | Attending: Emergency Medicine | Admitting: Emergency Medicine

## 2024-01-02 ENCOUNTER — Encounter (HOSPITAL_COMMUNITY): Payer: Self-pay

## 2024-01-02 ENCOUNTER — Emergency Department (HOSPITAL_COMMUNITY)

## 2024-01-02 DIAGNOSIS — O039 Complete or unspecified spontaneous abortion without complication: Secondary | ICD-10-CM | POA: Diagnosis not present

## 2024-01-02 DIAGNOSIS — Z3A01 Less than 8 weeks gestation of pregnancy: Secondary | ICD-10-CM | POA: Diagnosis not present

## 2024-01-02 DIAGNOSIS — R9389 Abnormal findings on diagnostic imaging of other specified body structures: Secondary | ICD-10-CM | POA: Diagnosis not present

## 2024-01-02 DIAGNOSIS — N939 Abnormal uterine and vaginal bleeding, unspecified: Secondary | ICD-10-CM

## 2024-01-02 DIAGNOSIS — O209 Hemorrhage in early pregnancy, unspecified: Secondary | ICD-10-CM | POA: Diagnosis not present

## 2024-01-02 DIAGNOSIS — O3680X0 Pregnancy with inconclusive fetal viability, not applicable or unspecified: Secondary | ICD-10-CM | POA: Diagnosis not present

## 2024-01-02 LAB — CBC
HCT: 35.1 % — ABNORMAL LOW (ref 36.0–46.0)
Hemoglobin: 11.6 g/dL — ABNORMAL LOW (ref 12.0–15.0)
MCH: 28.5 pg (ref 26.0–34.0)
MCHC: 33 g/dL (ref 30.0–36.0)
MCV: 86.2 fL (ref 80.0–100.0)
Platelets: 218 K/uL (ref 150–400)
RBC: 4.07 MIL/uL (ref 3.87–5.11)
RDW: 14.9 % (ref 11.5–15.5)
WBC: 7 K/uL (ref 4.0–10.5)
nRBC: 0 % (ref 0.0–0.2)

## 2024-01-02 LAB — BASIC METABOLIC PANEL WITH GFR
Anion gap: 8 (ref 5–15)
BUN: 10 mg/dL (ref 6–20)
CO2: 24 mmol/L (ref 22–32)
Calcium: 9 mg/dL (ref 8.9–10.3)
Chloride: 107 mmol/L (ref 98–111)
Creatinine, Ser: 0.62 mg/dL (ref 0.44–1.00)
GFR, Estimated: >60 mL/min (ref >60)
Glucose, Bld: 93 mg/dL (ref 70–99)
Potassium: 4 mmol/L (ref 3.5–5.1)
Sodium: 139 mmol/L (ref 135–145)

## 2024-01-02 LAB — ABO/RH: ABO/RH(D): O POS

## 2024-01-02 LAB — HCG, QUANTITATIVE, PREGNANCY: hCG, Beta Chain, Quant, S: 11 m[IU]/mL — ABNORMAL HIGH (ref <5)

## 2024-01-02 MED ORDER — ACETAMINOPHEN 325 MG PO TABS
650.0000 mg | ORAL_TABLET | Freq: Once | ORAL | Status: AC
  Administered 2024-01-02: 650 mg via ORAL
  Filled 2024-01-02: qty 2

## 2024-01-02 NOTE — ED Notes (Signed)
 Pt placed on 5 lead for heavy bleeding.

## 2024-01-02 NOTE — Discharge Instructions (Signed)
 Would strongly recommend that you follow-up with your gynecologist within 3 days for a recheck of your hCG.  Today it was 11.  I suspect that you have had a miscarriage, you do have a small amount of blood in the abdomen which can occur with these kind of things.  If your pain is worsening return to the ER, Tylenol  for pain, drink lots of fluids, call [facility] on Monday

## 2024-01-02 NOTE — ED Triage Notes (Signed)
 pt c/o vaginal bleeding; pt is [redacted] weeks gestation; denies prenatal visits; pt states bleeding is enough to soak at least two pads,  pt states just woke up 45 min. ago. Denies N/V/D, headaches, recent illness, exposure to illness, injury. A&Ox4. pt is G4P0A3.

## 2024-01-02 NOTE — ED Notes (Signed)
 Pt returned from CT

## 2024-01-02 NOTE — ED Provider Notes (Signed)
 [place] EMERGENCY DEPARTMENT AT [facility] Provider Note   CSN: [id number] Arrival date & time: 01/02/24  9255     Patient presents with: Vaginal Bleeding   Sheri Hill is a 33 y.o. female.    Vaginal Bleeding  This patient is a 33 year old female, she is G7, P3 at approximately 3 to [redacted] weeks gestation based on last menstrual cycle, she presents with a complaint of vaginal bleeding and pelvic pain that started this morning.  She was otherwise well prior to this, she has not seen a gynecologist for this pregnancy, she has no history of tubal pregnancy but was concerned about that today bit given the pain and bleeding.  No fevers no vomiting no diarrhea no swelling takes no daily medicines.    Prior to Admission medications   Not on File    Allergies: Patient has no known allergies.    Review of Systems  Genitourinary:  Positive for vaginal bleeding.  All other systems reviewed and are negative.   Updated Vital Signs BP 120/74 (BP Location: Left Arm)   Pulse 74   Temp 98.1 F (36.7 C) (Oral)   Resp 16   Ht 1.626 m (5' 4)   Wt 46.7 kg   LMP 09/20/2023   SpO2 99%   BMI 17.68 kg/m   Physical Exam Vitals and nursing note reviewed.  Constitutional:      General: She is not in acute distress.    Appearance: She is well-developed.  HENT:     Head: Normocephalic and atraumatic.     Mouth/Throat:     Pharynx: No oropharyngeal exudate.  Eyes:     General: No scleral icterus.       Right eye: No discharge.        Left eye: No discharge.     Conjunctiva/sclera: Conjunctivae normal.     Pupils: Pupils are equal, round, and reactive to light.  Neck:     Thyroid: No thyromegaly.     Vascular: No JVD.  Cardiovascular:     Rate and Rhythm: Normal rate and regular rhythm.     Heart sounds: Normal heart sounds. No murmur heard.    No friction rub. No gallop.  Pulmonary:     Effort: Pulmonary effort is normal. No respiratory distress.     Breath sounds:  Normal breath sounds. No wheezing or rales.  Abdominal:     General: Bowel sounds are normal. There is no distension.     Palpations: Abdomen is soft. There is no mass.     Tenderness: There is abdominal tenderness.  Musculoskeletal:        General: No tenderness. Normal range of motion.     Cervical back: Normal range of motion and neck supple.  Lymphadenopathy:     Cervical: No cervical adenopathy.  Skin:    General: Skin is warm and dry.     Findings: No erythema or rash.  Neurological:     Mental Status: She is alert.     Coordination: Coordination normal.  Psychiatric:        Behavior: Behavior normal.     (all labs ordered are listed, but only abnormal results are displayed) Labs Reviewed  HCG, QUANTITATIVE, PREGNANCY - Abnormal; Notable for the following components:      Result Value   hCG, Beta Chain, Quant, S 11 (*)    All other components within normal limits  CBC - Abnormal; Notable for the following components:   Hemoglobin 11.6 (*)  HCT 35.1 (*)    All other components within normal limits  BASIC METABOLIC PANEL WITH GFR  ABO/RH    EKG: None  Radiology: US  OB LESS THAN 14 WEEKS WITH OB TRANSVAGINAL Result Date: 01/02/2024 CLINICAL DATA:  33 year old female with bleeding in the 1st trimester of pregnancy. Positive beta HCG, estimated gestational age by LMP 4 weeks and 6 days. EXAM: OBSTETRIC <14 WK US  AND TRANSVAGINAL OB US  TECHNIQUE: Both transabdominal and transvaginal ultrasound examinations were performed for complete evaluation of the gestation as well as the maternal uterus, adnexal regions, and pelvic cul-de-sac. Transvaginal technique was performed to assess early pregnancy. COMPARISON:  None relevant. FINDINGS: Intrauterine gestational sac: None. Maternal uterus/adnexae: Bland appearance of the uterus endometrium which is thickened up to 14 mm (series 1, image 88). Moderate volume of echogenic free fluid in the pelvis (image 98). Right ovary measures 3.6  x 1.6 by 2.0 cm (6 mL) and appears normal. Left ovary measures 1.9 x 3.0 x 1.5 cm (4 mL) and appears normal. IMPRESSION: 1. Moderate volume of echogenic free fluid in the pelvis suspicious for Hemoperitoneum. Recommend correlation with Hemoglobin/Hematocrit. 2. No IUP identified. Ovaries/adnexa appear normal. Differential considerations include early IUP, failed IUP, Occult Ectopic pregnancy. 3. Recommend serial quantitative beta HCG and repeat Ultrasound as necessary. Electronically Signed   By: VEAR Hurst M.D.   On: 01/02/2024 09:31     Procedures   Medications Ordered in the ED  acetaminophen  (TYLENOL ) tablet 650 mg (650 mg Oral Given 01/02/24 0922)    Clinical Course as of 01/02/24 1007  Sun Jan 02, 2024  1005 The ultrasound of the patient shows no obvious intrauterine pregnancy, there is some fluid in the pelvis, would consider that this could be related to ovarian cyst but with an hCG of 11 it is unlikely that this is going to be related to a normal pregnancy, doubt ectopic given that extremely low quant, can follow-up with GYN, hemoglobin 11.6, hemodynamically stable with no tachycardia and no hypoxia and no hypotension. [BM]    Clinical Course User Index [BM] Cleotilde Rogue, MD                                 Medical Decision Making Amount and/or Complexity of Data Reviewed Labs: ordered. Radiology: ordered.  Risk OTC drugs.    This patient presents to the ED for concern of abdominal pain bleeding in pregnancy, this involves an extensive number of treatment options, and is a complaint that carries with it a high risk of complications and morbidity.  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  The pertinent results include: hCG of 11, hemoglobin of 11.6 which is as high as she has been over the last few years, metabolic panel is normal, she has a positive   Imaging Studies ordered:  I ordered imaging studies including ultrasound I independently visualized and interpreted imaging which showed no IUP, some fluid in the pelvis I agree with the radiologist interpretation   Cardiac Monitoring: / EKG:  The patient was maintained on a cardiac monitor.  I personally viewed and interpreted the cardiac monitored which showed an underlying rhythm of: Normal sinus rhythm   Problem List / ED Course / Critical interventions / Medication management  Patient informed of results, doubt ectopic, likely miscarriage I ordered medication including Tylenol  Reevaluation of the patient after these medicines showed that the patient stable I have reviewed the patients home medicines and have made adjustments as needed   Consultations Obtained:  Outpatient gynecology referral  Social Determinants of Health:  Multiple miscarriages   Test / Admission - Considered:  Stable for discharge  I have discussed with the patient at the bedside the results, and the meaning of these results.  They have had opportunity to ask questions,  expressed their understanding to the need for follow-up with primary care physician      Final diagnoses:  Vaginal bleeding  Miscarriage    ED Discharge Orders     None          Cleotilde Rogue, MD 01/02/24 1007

## 2024-05-17 ENCOUNTER — Ambulatory Visit

## 2024-05-17 DIAGNOSIS — Z3201 Encounter for pregnancy test, result positive: Secondary | ICD-10-CM | POA: Diagnosis not present

## 2024-05-17 LAB — POCT URINE PREGNANCY: Preg Test, Ur: POSITIVE — AB

## 2024-05-17 NOTE — Progress Notes (Signed)
   NURSE VISIT- PREGNANCY CONFIRMATION   SUBJECTIVE:  Sheri Hill is a 33 y.o. [phone] female at [redacted]w[redacted]d by certain LMP of Patient's last menstrual period was 04/19/2024. Here for pregnancy confirmation.  Home pregnancy test: positive x 1  She reports no complaints.  She is not taking prenatal vitamins.    OBJECTIVE:  LMP 04/19/2024   Appears well, in no apparent distress  Results for orders placed or performed in visit on 05/17/24 (from the past 24 hours)  POCT urine pregnancy   Collection Time: 05/17/24 10:56 AM  Result Value Ref Range   Preg Test, Ur Positive (A) Negative    ASSESSMENT: Positive pregnancy test, [redacted]w[redacted]d by LMP    PLAN: Schedule for dating ultrasound in 3 weeks Prenatal vitamins: plans to begin OTC ASAP   Nausea medicines: not currently needed   OB packet given: Yes  Alan LITTIE Fischer  05/17/2024 10:58 AM

## 2024-06-05 ENCOUNTER — Other Ambulatory Visit: Payer: Self-pay | Admitting: Obstetrics & Gynecology

## 2024-06-05 DIAGNOSIS — O3680X0 Pregnancy with inconclusive fetal viability, not applicable or unspecified: Secondary | ICD-10-CM

## 2024-06-12 ENCOUNTER — Ambulatory Visit: Admitting: Radiology

## 2024-06-12 ENCOUNTER — Encounter: Payer: Self-pay | Admitting: Radiology

## 2024-06-12 DIAGNOSIS — Z3687 Encounter for antenatal screening for uncertain dates: Secondary | ICD-10-CM

## 2024-06-12 DIAGNOSIS — Z3A01 Less than 8 weeks gestation of pregnancy: Secondary | ICD-10-CM

## 2024-06-12 DIAGNOSIS — O3680X0 Pregnancy with inconclusive fetal viability, not applicable or unspecified: Secondary | ICD-10-CM

## 2024-06-12 NOTE — Progress Notes (Signed)
 US : GA = 7+5 weeks Hx SAB x 3  Retroverted uterus with single viable early IUP.  GS intact within upper mid uterine cavity, anterior placenta intact, gr0 CRL = 15.3 mm = 7+6 weeks,  Yolk Sac =4.1 mm, FHR = 169 bpm Normal ov's,  Neg adnexal regions - neg CDS - no free fluid present

## 2024-07-12 ENCOUNTER — Encounter: Payer: Self-pay | Admitting: Advanced Practice Midwife

## 2024-07-12 ENCOUNTER — Other Ambulatory Visit (HOSPITAL_COMMUNITY)
Admission: RE | Admit: 2024-07-12 | Discharge: 2024-07-12 | Disposition: A | Discharge status: Home / Self Care | Source: Ambulatory Visit | Attending: Advanced Practice Midwife | Admitting: Advanced Practice Midwife

## 2024-07-12 ENCOUNTER — Ambulatory Visit: Admitting: Advanced Practice Midwife

## 2024-07-12 ENCOUNTER — Encounter: Admitting: *Deleted

## 2024-07-12 VITALS — BP 130/87 | HR 103 | Wt 112.0 lb

## 2024-07-12 DIAGNOSIS — Z113 Encounter for screening for infections with a predominantly sexual mode of transmission: Secondary | ICD-10-CM | POA: Diagnosis present

## 2024-07-12 DIAGNOSIS — Z3480 Encounter for supervision of other normal pregnancy, unspecified trimester: Secondary | ICD-10-CM | POA: Insufficient documentation

## 2024-07-12 DIAGNOSIS — Z8659 Personal history of other mental and behavioral disorders: Secondary | ICD-10-CM

## 2024-07-12 DIAGNOSIS — Z1332 Encounter for screening for maternal depression: Secondary | ICD-10-CM | POA: Diagnosis not present

## 2024-07-12 DIAGNOSIS — Z131 Encounter for screening for diabetes mellitus: Secondary | ICD-10-CM

## 2024-07-12 DIAGNOSIS — Z3490 Encounter for supervision of normal pregnancy, unspecified, unspecified trimester: Secondary | ICD-10-CM | POA: Insufficient documentation

## 2024-07-12 DIAGNOSIS — Z3A12 12 weeks gestation of pregnancy: Secondary | ICD-10-CM

## 2024-07-12 DIAGNOSIS — Z3481 Encounter for supervision of other normal pregnancy, first trimester: Secondary | ICD-10-CM

## 2024-07-12 DIAGNOSIS — Z363 Encounter for antenatal screening for malformations: Secondary | ICD-10-CM

## 2024-07-12 MED ORDER — BLOOD PRESSURE CUFF MISC: 0 refills | Status: AC

## 2024-07-12 NOTE — Patient Instructions (Signed)
 Sheri Hill, thank you for choosing our office today! We appreciate the opportunity to meet your healthcare needs. You may receive a short survey by mail, e-mail, or through [employer]. If you are happy with your care we would appreciate if you could take just a few minutes to complete the survey questions. We read all of your comments and take your feedback very seriously. Thank you again for choosing our office.  Center for [employer] Healthcare Team at [facility]  [facility] at [facility] ([address], [place] [address]) Entrance C, located off of [address] Free 24/7 valet parking   Nausea & Vomiting Have saltine crackers or pretzels by your bed and eat a few bites before you raise your head out of bed in the morning Eat small frequent meals throughout the day instead of large meals Drink plenty of fluids throughout the day to stay hydrated, just don't drink a lot of fluids with your meals.  This can make your stomach fill up faster making you feel sick Do not brush your teeth right after you eat Products with real ginger are good for nausea, like ginger ale and ginger hard candy Make sure it says made with real ginger! Sucking on sour candy like lemon heads is also good for nausea If your prenatal vitamins make you nauseated, take them at night so you will sleep through the nausea Sea Bands If you feel like you need medicine for the nausea & vomiting please let us  know If you are unable to keep any fluids or food down please let us  know   Constipation Drink plenty of fluid, preferably water, throughout the day Eat foods high in fiber such as fruits, vegetables, and grains Exercise, such as walking, is a good way to keep your bowels regular Drink warm fluids, especially warm prune juice, or decaf coffee Eat a 1/2 cup of real oatmeal (not instant), 1/2 cup applesauce, and 1/2-1 cup warm prune juice every day If needed, you may take Colace (docusate sodium ) stool softener  once or twice a day to help keep the stool soft.  If you still are having problems with constipation, you may take Miralax once daily as needed to help keep your bowels regular.   Home Blood Pressure Monitoring for Patients   Your provider has recommended that you check your blood pressure (BP) at least once a week at home. If you do not have a blood pressure cuff at home, one will be provided for you. Contact your provider if you have not received your monitor within 1 week.   Helpful Tips for Accurate Home Blood Pressure Checks  Don't smoke, exercise, or drink caffeine  30 minutes before checking your BP Use the restroom before checking your BP (a full bladder can raise your pressure) Relax in a comfortable upright chair Feet on the ground Left arm resting comfortably on a flat surface at the level of your heart Legs uncrossed Back supported Sit quietly and don't talk Place the cuff on your bare arm Adjust snuggly, so that only two fingertips can fit between your skin and the top of the cuff Check 2 readings separated by at least one minute Keep a log of your BP readings For a visual, please reference this diagram: http://ccnc.care/bpdiagram  Provider Name: [facility]     Phone: [phone]  Zone 1: ALL CLEAR  Continue to monitor your symptoms:  BP reading is less than 140 (top number) or less than 90 (bottom  number)  No right upper stomach pain No headaches or seeing spots No feeling nauseated or throwing up No swelling in face and hands  Zone 2: CAUTION Call your doctor's office for any of the following:  BP reading is greater than 140 (top number) or greater than 90 (bottom number)  Stomach pain under your ribs in the middle or right side Headaches or seeing spots Feeling nauseated or throwing up Swelling in face and hands  Zone 3: EMERGENCY  Seek immediate medical care if you have any of the following:  BP reading is greater than160 (top number) or greater than  110 (bottom number) Severe headaches not improving with Tylenol  Serious difficulty catching your breath Any worsening symptoms from Zone 2    First Trimester of Pregnancy The first trimester of pregnancy is from week 1 until the end of week 12 (months 1 through 3). A week after a sperm fertilizes an egg, the egg will implant on the wall of the uterus. This embryo will begin to develop into a baby. Genes from you and your partner are forming the baby. The female genes determine whether the baby is a boy or a girl. At 6-8 weeks, the eyes and face are formed, and the heartbeat can be seen on ultrasound. At the end of 12 weeks, all the baby's organs are formed.  Now that you are pregnant, you will want to do everything you can to have a healthy baby. Two of the most important things are to get good prenatal care and to follow your health care provider's instructions. Prenatal care is all the medical care you receive before the baby's birth. This care will help prevent, find, and treat any problems during the pregnancy and childbirth. BODY CHANGES Your body goes through many changes during pregnancy. The changes vary from woman to woman.  You may gain or lose a couple of pounds at first. You may feel sick to your stomach (nauseous) and throw up (vomit). If the vomiting is uncontrollable, call your health care provider. You may tire easily. You may develop headaches that can be relieved by medicines approved by your health care provider. You may urinate more often. Painful urination may mean you have a bladder infection. You may develop heartburn as a result of your pregnancy. You may develop constipation because certain hormones are causing the muscles that push waste through your intestines to slow down. You may develop hemorrhoids or swollen, bulging veins (varicose veins). Your breasts may begin to grow larger and become tender. Your nipples may stick out more, and the tissue that surrounds them  (areola) may become darker. Your gums may bleed and may be sensitive to brushing and flossing. Dark spots or blotches (chloasma, mask of pregnancy) may develop on your face. This will likely fade after the baby is born. Your menstrual periods will stop. You may have a loss of appetite. You may develop cravings for certain kinds of food. You may have changes in your emotions from day to day, such as being excited to be pregnant or being concerned that something may go wrong with the pregnancy and baby. You may have more vivid and strange dreams. You may have changes in your hair. These can include thickening of your hair, rapid growth, and changes in texture. Some women also have hair loss during or after pregnancy, or hair that feels dry or thin. Your hair will most likely return to normal after your baby is born. WHAT TO EXPECT AT YOUR PRENATAL  VISITS During a routine prenatal visit: You will be weighed to make sure you and the baby are growing normally. Your blood pressure will be taken. Your abdomen will be measured to track your baby's growth. The fetal heartbeat will be listened to starting around week 10 or 12 of your pregnancy. Test results from any previous visits will be discussed. Your health care provider may ask you: How you are feeling. If you are feeling the baby move. If you have had any abnormal symptoms, such as leaking fluid, bleeding, severe headaches, or abdominal cramping. If you have any questions. Other tests that may be performed during your first trimester include: Blood tests to find your blood type and to check for the presence of any previous infections. They will also be used to check for low iron levels (anemia) and Rh antibodies. Later in the pregnancy, blood tests for diabetes will be done along with other tests if problems develop. Urine tests to check for infections, diabetes, or protein in the urine. An ultrasound to confirm the proper growth and development  of the baby. An amniocentesis to check for possible genetic problems. Fetal screens for spina bifida and Down syndrome. You may need other tests to make sure you and the baby are doing well. HOME CARE INSTRUCTIONS  Medicines Follow your health care provider's instructions regarding medicine use. Specific medicines may be either safe or unsafe to take during pregnancy. Take your prenatal vitamins as directed. If you develop constipation, try taking a stool softener if your health care provider approves. Diet Eat regular, well-balanced meals. Choose a variety of foods, such as meat or vegetable-based protein, fish, milk and low-fat dairy products, vegetables, fruits, and whole grain breads and cereals. Your health care provider will help you determine the amount of weight gain that is right for you. Avoid raw meat and uncooked cheese. These carry germs that can cause birth defects in the baby. Eating four or five small meals rather than three large meals a day may help relieve nausea and vomiting. If you start to feel nauseous, eating a few soda crackers can be helpful. Drinking liquids between meals instead of during meals also seems to help nausea and vomiting. If you develop constipation, eat more high-fiber foods, such as fresh vegetables or fruit and whole grains. Drink enough fluids to keep your urine clear or pale yellow. Activity and Exercise Exercise only as directed by your health care provider. Exercising will help you: Control your weight. Stay in shape. Be prepared for labor and delivery. Experiencing pain or cramping in the lower abdomen or low back is a good sign that you should stop exercising. Check with your health care provider before continuing normal exercises. Try to avoid standing for long periods of time. Move your legs often if you must stand in one place for a long time. Avoid heavy lifting. Wear low-heeled shoes, and practice good posture. You may continue to have sex  unless your health care provider directs you otherwise. Relief of Pain or Discomfort Wear a good support bra for breast tenderness.   Take warm sitz baths to soothe any pain or discomfort caused by hemorrhoids. Use hemorrhoid cream if your health care provider approves.   Rest with your legs elevated if you have leg cramps or low back pain. If you develop varicose veins in your legs, wear support hose. Elevate your feet for 15 minutes, 3-4 times a day. Limit salt in your diet. Prenatal Care Schedule your prenatal visits by the  twelfth week of pregnancy. They are usually scheduled monthly at first, then more often in the last 2 months before delivery. Write down your questions. Take them to your prenatal visits. Keep all your prenatal visits as directed by your health care provider. Safety Wear your seat belt at all times when driving. Make a list of emergency phone numbers, including numbers for family, friends, the hospital, and police and fire departments. General Tips Ask your health care provider for a referral to a local prenatal education class. Begin classes no later than at the beginning of month 6 of your pregnancy. Ask for help if you have counseling or nutritional needs during pregnancy. Your health care provider can offer advice or refer you to specialists for help with various needs. Do not use hot tubs, steam rooms, or saunas. Do not douche or use tampons or scented sanitary pads. Do not cross your legs for long periods of time. Avoid cat litter boxes and soil used by cats. These carry germs that can cause birth defects in the baby and possibly loss of the fetus by miscarriage or stillbirth. Avoid all smoking, herbs, alcohol, and medicines not prescribed by your health care provider. Chemicals in these affect the formation and growth of the baby. Schedule a dentist appointment. At home, brush your teeth with a soft toothbrush and be gentle when you floss. SEEK MEDICAL CARE IF:   You have dizziness. You have mild pelvic cramps, pelvic pressure, or nagging pain in the abdominal area. You have persistent nausea, vomiting, or diarrhea. You have a bad smelling vaginal discharge. You have pain with urination. You notice increased swelling in your face, hands, legs, or ankles. SEEK IMMEDIATE MEDICAL CARE IF:  You have a fever. You are leaking fluid from your vagina. You have spotting or bleeding from your vagina. You have severe abdominal cramping or pain. You have rapid weight gain or loss. You vomit blood or material that looks like coffee grounds. You are exposed to German measles and have never had them. You are exposed to fifth disease or chickenpox. You develop a severe headache. You have shortness of breath. You have any kind of trauma, such as from a fall or a car accident. Document Released: 05/26/2001 Document Revised: 10/16/2013 Document Reviewed: 04/11/2013 [facility] Patient Information 2015 [place], [place]. This information is not intended to replace advice given to you by your health care provider. Make sure you discuss any questions you have with your health care provider.

## 2024-07-12 NOTE — Progress Notes (Signed)
 "   INITIAL OBSTETRICAL VISIT Patient name: Sheri Hill MRN [id number]  Date of birth: 1990-12-12 Chief Complaint:   Initial Prenatal Visit  History of Present Illness:   Sheri Hill is a 34 y.o. [id number] Caucasian female at [redacted]w[redacted]d by LMP c/w u/s at 7.6 weeks with an Estimated Date of Delivery: 01/24/25 being seen today for her initial obstetrical visit.   Patient's last menstrual period was 04/19/2024. Her obstetrical history is significant for term vag deliveries x 3 (2017, 2019, 2020) followed by SAB x 3..   Today she reports no complaints.  Last pap Sept 2023. Results were: NILM w/ HRHPV negative     07/12/2024   11:36 AM 03/09/2022    9:41 AM 07/26/2017    1:52 PM  Depression screen PHQ 2/9  Decreased Interest 0 0 0  Down, Depressed, Hopeless 0 0 0  PHQ - 2 Score 0 0 0  Altered sleeping 0 0 0  Tired, decreased energy 0 0 0  Change in appetite 0 0 0  Feeling bad or failure about yourself  0 0 0  Trouble concentrating 0 0 0  Moving slowly or fidgety/restless 0 0 0  Suicidal thoughts 0 0 0  PHQ-9 Score 0 0  0   Difficult doing work/chores   Not difficult at all     Data saved with a previous flowsheet row definition        07/12/2024   11:36 AM 03/09/2022    9:41 AM  GAD 7 : Generalized Anxiety Score  Nervous, Anxious, on Edge 0 0   Control/stop worrying 0 0   Worry too much - different things 0 0   Trouble relaxing 0 0   Restless 0 0   Easily annoyed or irritable 0 0   Afraid - awful might happen 0 0   Total GAD 7 Score 0 0     Data saved with a previous flowsheet row definition     Review of Systems:   Pertinent items are noted in HPI Denies cramping/contractions, leakage of fluid, vaginal bleeding, abnormal vaginal discharge w/ itching/odor/irritation, headaches, visual changes, shortness of breath, chest pain, abdominal pain, severe nausea/vomiting, or problems with urination or bowel movements unless otherwise stated above.  Pertinent History Reviewed:  Reviewed  past medical,surgical, social, obstetrical and family history.  Reviewed problem list, medications and allergies. OB History  Gravida Para Term Preterm AB Living  7 3 3  3 3   SAB IAB Ectopic Multiple Live Births  3   0 3    # Outcome Date GA Lbr Len/2nd Weight Sex Type Anes PTL Lv  7 Current           6 SAB 01/02/24          5 SAB 02/2022          4 SAB 05/01/20 [redacted]w[redacted]d         3 Term 04/14/19 [redacted]w[redacted]d 03:02 / 00:06 6 lb 2.8 oz (2.8 kg) F Vag-Spont None  LIV  2 Term 11/09/17 [redacted]w[redacted]d 09:02 / 00:04 6 lb 10 oz (3.005 kg) F Vag-Spont Local  LIV  1 Term 12/09/15 [redacted]w[redacted]d  6 lb 8 oz (2.948 kg) F Vag-Spont None N LIV   Physical Assessment:   Vitals:   07/12/24 1129  BP: 130/87  Pulse: (!) 103  Weight: 112 lb (50.8 kg)  Body mass index is 19.22 kg/m.       Physical Examination:  General appearance - well appearing, and in no distress  Mental status - alert, oriented to person, place, and time  Psych:  She has a normal mood and affect  Skin - warm and dry, normal color, no suspicious lesions noted  Chest - effort normal, all lung fields clear to auscultation bilaterally  Heart - normal rate and regular rhythm  Abdomen - soft, nontender; FHTs 172 bpm  Extremities:  No swelling or varicosities noted  Pelvic - not indicted  Thin prep pap is not done    No results found for this or any previous visit (from the past 24 hours).  Assessment & Plan:  1) Low-Risk Pregnancy [id number] at [redacted]w[redacted]d with an Estimated Date of Delivery: 01/24/25   2) Initial OB visit  3) Hx PPD  Meds:  Meds ordered this encounter  Medications   Blood Pressure Monitoring (BLOOD PRESSURE CUFF) MISC    Sig: Use as directed to check blood pressure daily    Dispense:  1 each    Refill:  0    Z34.00 Please mail to patient    Initial labs obtained Continue prenatal vitamins Reviewed n/v relief measures and warning s/s to report Reviewed recommended weight gain based on pre-gravid BMI Encouraged well-balanced  diet Genetic & carrier screening discussed: declines Panorama and AFP, declines Horizon  Ultrasound discussed; fetal survey: requested CCNC completed> form faxed if has or is planning to apply for medicaid The nature of [employer] for [employer] with multiple MDs and other Advanced Practice Providers was explained to patient; also emphasized that fellows, residents, and students are part of our team. Does not have home bp cuff. Office bp cuff given: no. Rx sent: yes. Check bp weekly, let us  know if consistently >140/90.   No indications for ASA therapy (per uptodate)  Follow-up: Return for 4wk LROB; 8wk LROB and anatomy u/s.   Orders Placed This Encounter  Procedures   Urine Culture   US  OB Comp + 14 Wk   CBC/D/Plt+RPR+Rh+ABO+RubIgG...   Hemoglobin A1c    Suzen JONETTA Gentry CNM 07/12/2024 12:50 PM  "

## 2024-07-13 LAB — CBC/D/PLT+RPR+RH+ABO+RUBIGG...
Antibody Screen: NEGATIVE
Basophils Absolute: 0 10*3/uL (ref 0.0–0.2)
Basos: 0 %
EOS (ABSOLUTE): 0.6 10*3/uL — ABNORMAL HIGH (ref 0.0–0.4)
Eos: 6 %
HCV Ab: NONREACTIVE
HIV Screen 4th Generation wRfx: NONREACTIVE
Hematocrit: 40.3 % (ref 34.0–46.6)
Hemoglobin: 13.7 g/dL (ref 11.1–15.9)
Hepatitis B Surface Ag: NEGATIVE
Immature Grans (Abs): 0 10*3/uL (ref 0.0–0.1)
Immature Granulocytes: 0 %
Lymphocytes Absolute: 1.3 10*3/uL (ref 0.7–3.1)
Lymphs: 13 %
MCH: 30 pg (ref 26.6–33.0)
MCHC: 34 g/dL (ref 31.5–35.7)
MCV: 88 fL (ref 79–97)
Monocytes Absolute: 0.7 10*3/uL (ref 0.1–0.9)
Monocytes: 7 %
Neutrophils Absolute: 7.9 10*3/uL — ABNORMAL HIGH (ref 1.4–7.0)
Neutrophils: 74 %
Platelets: 273 10*3/uL (ref 150–450)
RBC: 4.57 x10E6/uL (ref 3.77–5.28)
RDW: 13.1 % (ref 11.7–15.4)
RPR Ser Ql: NONREACTIVE
Rh Factor: POSITIVE
Rubella Antibodies, IGG: 1.89 {index}
WBC: 10.6 10*3/uL (ref 3.4–10.8)

## 2024-07-13 LAB — CERVICOVAGINAL ANCILLARY ONLY
Chlamydia: NEGATIVE
Comment: NEGATIVE
Comment: NORMAL
Neisseria Gonorrhea: NEGATIVE

## 2024-07-13 LAB — HCV INTERPRETATION

## 2024-07-13 LAB — HEMOGLOBIN A1C
Est. average glucose Bld gHb Est-mCnc: 100 mg/dL
Hgb A1c MFr Bld: 5.1 % (ref 4.8–5.6)

## 2024-07-14 LAB — URINE CULTURE

## 2024-08-09 ENCOUNTER — Encounter: Admitting: Advanced Practice Midwife

## 2024-09-06 ENCOUNTER — Other Ambulatory Visit: Admitting: Radiology

## 2024-09-06 ENCOUNTER — Encounter: Admitting: Advanced Practice Midwife
